# Patient Record
Sex: Male | Born: 1935 | Race: White | Hispanic: No | Marital: Married
Health system: Southern US, Community
[De-identification: ages and names within clinical notes are randomized; demographics above are authoritative.]

---

## 2004-08-08 ENCOUNTER — Ambulatory Visit: Payer: Self-pay | Admitting: Internal Medicine

## 2004-11-20 ENCOUNTER — Ambulatory Visit: Payer: Self-pay | Admitting: Internal Medicine

## 2005-10-10 ENCOUNTER — Other Ambulatory Visit: Payer: Self-pay

## 2005-10-10 ENCOUNTER — Emergency Department: Payer: Self-pay | Admitting: Emergency Medicine

## 2007-08-04 ENCOUNTER — Other Ambulatory Visit: Payer: Self-pay

## 2007-08-04 ENCOUNTER — Inpatient Hospital Stay: Payer: Self-pay | Admitting: Cardiology

## 2007-08-05 ENCOUNTER — Other Ambulatory Visit: Payer: Self-pay

## 2007-08-06 ENCOUNTER — Other Ambulatory Visit: Payer: Self-pay

## 2007-12-12 ENCOUNTER — Emergency Department: Payer: Self-pay | Admitting: Emergency Medicine

## 2007-12-12 ENCOUNTER — Other Ambulatory Visit: Payer: Self-pay

## 2008-02-06 ENCOUNTER — Ambulatory Visit: Payer: Self-pay | Admitting: Cardiology

## 2008-02-09 ENCOUNTER — Ambulatory Visit: Payer: Self-pay | Admitting: Cardiology

## 2008-02-13 ENCOUNTER — Inpatient Hospital Stay: Payer: Self-pay | Admitting: Cardiology

## 2008-02-22 ENCOUNTER — Emergency Department: Payer: Self-pay

## 2008-02-26 ENCOUNTER — Ambulatory Visit: Payer: Self-pay

## 2008-02-27 ENCOUNTER — Emergency Department: Payer: Self-pay | Admitting: Emergency Medicine

## 2008-02-29 ENCOUNTER — Inpatient Hospital Stay: Payer: Self-pay | Admitting: Internal Medicine

## 2008-03-05 ENCOUNTER — Encounter: Payer: Self-pay | Admitting: Internal Medicine

## 2008-03-10 ENCOUNTER — Ambulatory Visit: Payer: Self-pay | Admitting: Internal Medicine

## 2008-03-10 ENCOUNTER — Encounter: Payer: Self-pay | Admitting: Internal Medicine

## 2008-08-23 ENCOUNTER — Inpatient Hospital Stay: Payer: Self-pay | Admitting: Internal Medicine

## 2008-08-30 ENCOUNTER — Inpatient Hospital Stay: Payer: Self-pay | Admitting: Internal Medicine

## 2008-09-07 ENCOUNTER — Encounter: Payer: Self-pay | Admitting: Internal Medicine

## 2008-09-10 ENCOUNTER — Encounter: Payer: Self-pay | Admitting: Internal Medicine

## 2009-01-06 ENCOUNTER — Ambulatory Visit: Payer: Self-pay | Admitting: Internal Medicine

## 2009-02-02 ENCOUNTER — Inpatient Hospital Stay: Payer: Self-pay | Admitting: Vascular Surgery

## 2009-02-09 ENCOUNTER — Ambulatory Visit: Payer: Self-pay | Admitting: Podiatry

## 2009-02-11 ENCOUNTER — Ambulatory Visit: Payer: Self-pay | Admitting: Podiatry

## 2009-02-21 IMAGING — CR DG ABDOMEN 3V
1 series · 5 of 5 positions shown · non-contrast
Comparison: none

REASON FOR EXAM: CONSTIPATION
COMMENTS:

[Series 1: view not recorded · 0.17mm/px · 5 of 5 slices shown]
[im 1/5]
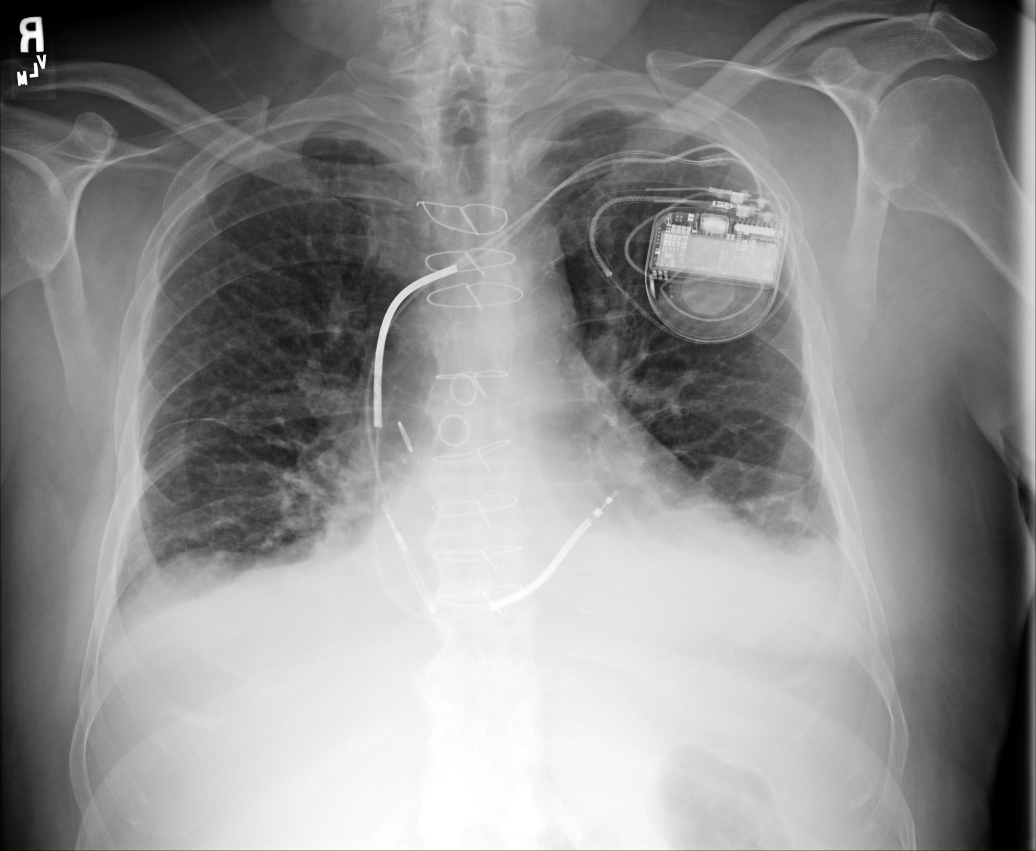
[im 2/5]
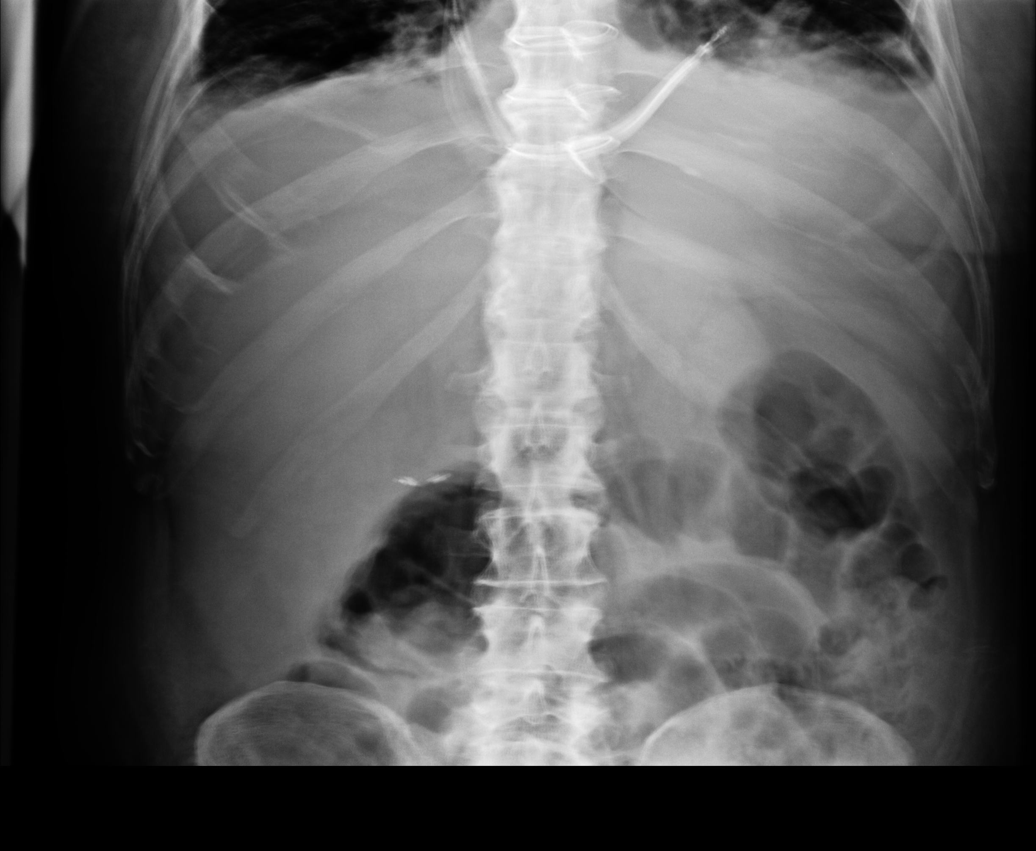
[im 3/5]
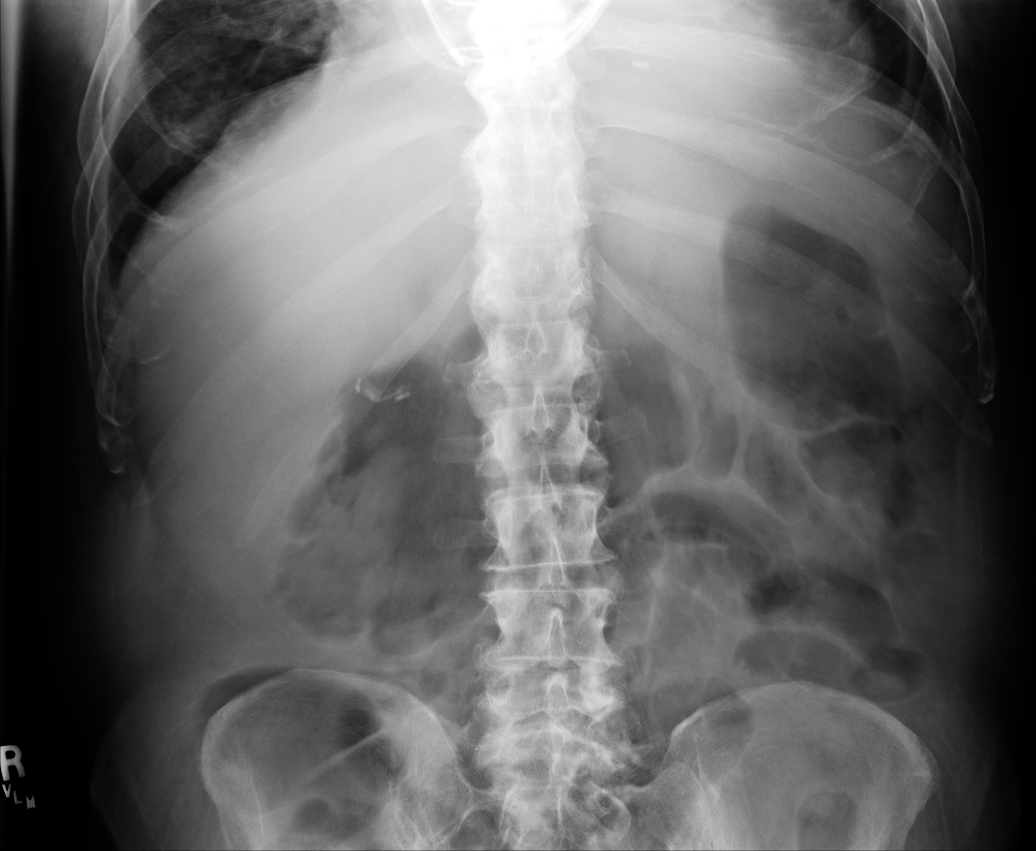
[im 4/5]
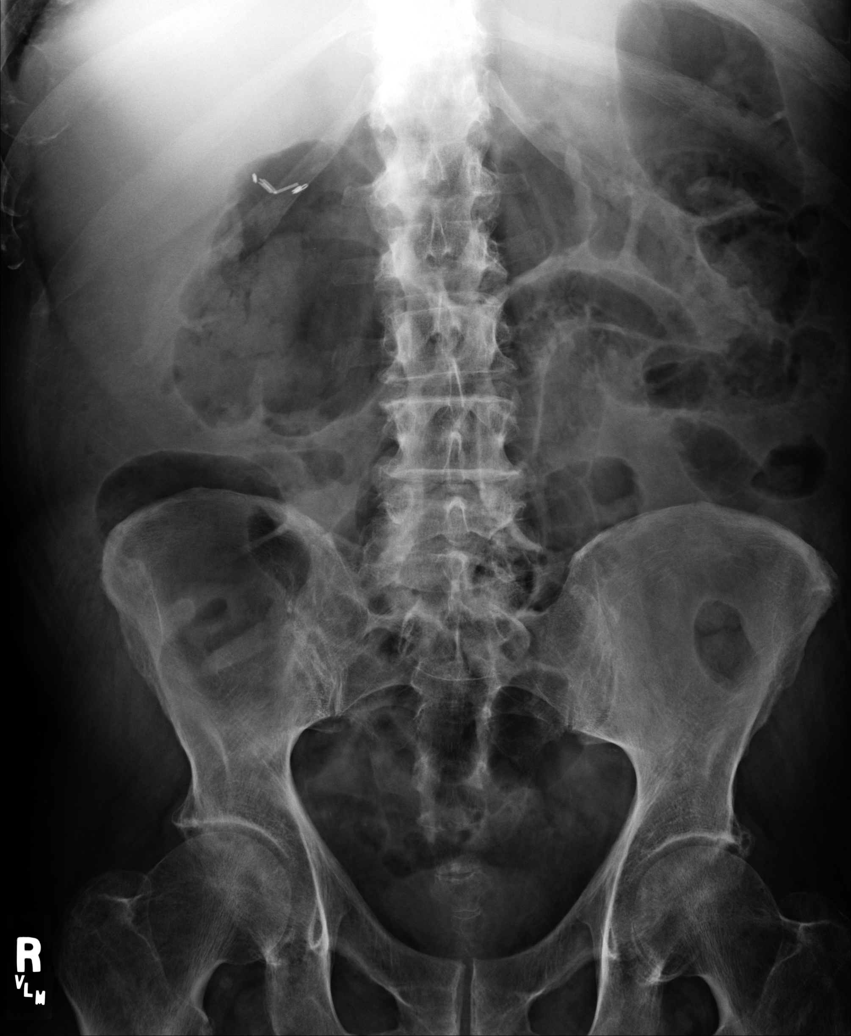
[im 5/5]
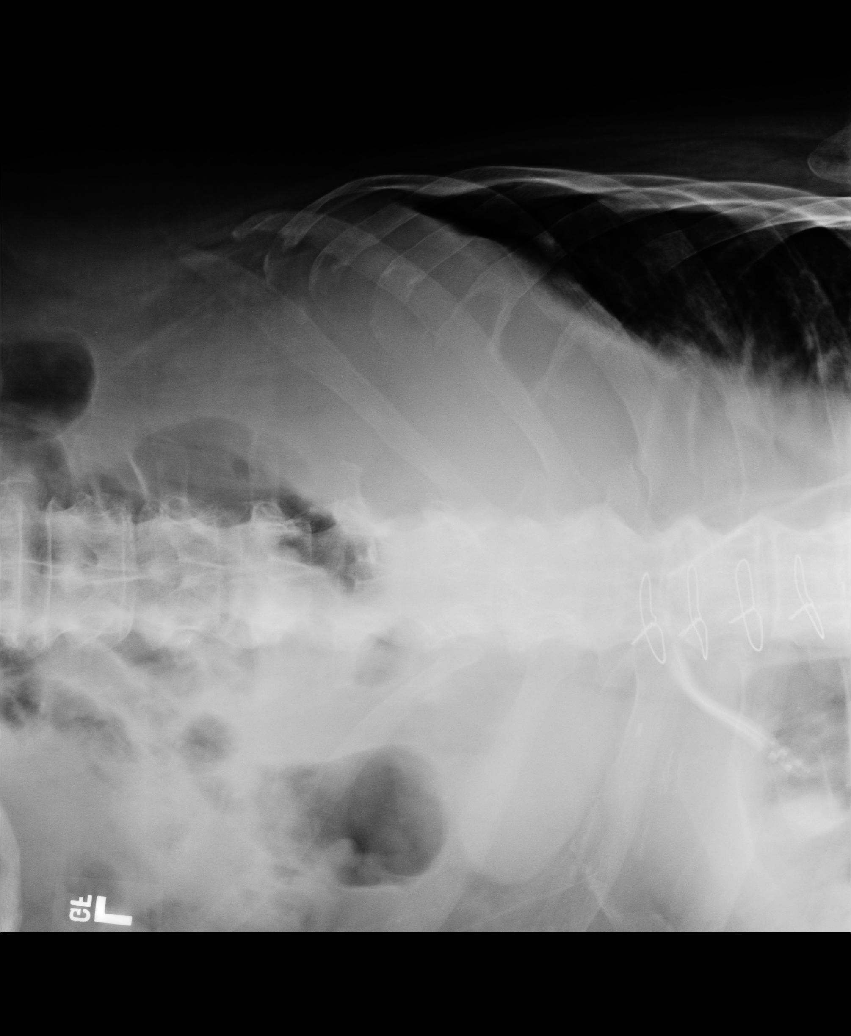

[5 of 5 positions shown; findings below may reference images not displayed]

PROCEDURE:     DXR - DXR ABDOMEN 3-WAY (INCL PA CXR)  - February 22, 2008  [DATE]

RESULT:     Comparison is made to a study 14 February, 2008.

The lungs are hypoinflated. The interstitial markings are more prominent
bilaterally. The LEFT hemidiaphragm is partially obscured. There is a
permanent pacemaker defibrillator in place. The patient has undergone prior
CABG. Within the abdomen,  there are loops of mildly distended gas-filled
small and large bowel present. The pattern is nonspecific, however, with no
finding to suggest obstruction. No free extraluminal gas is seen. There is
degenerative disc disease of the lumbar spine. There are surgical clips in
the gallbladder fossa.
IMPRESSION: 1.     The bowel gas pattern is nonspecific. No objective evidence of
obstruction or significant ileus is seen at this point. I cannot exclude
early ileus.
2.     There are findings which may reflect an element of cardiac
decompensation with pulmonary interstitial edema and small LEFT pleural
effusion. Continued followup films would be of value.

## 2009-09-27 ENCOUNTER — Inpatient Hospital Stay: Payer: Self-pay | Admitting: Internal Medicine

## 2010-07-21 ENCOUNTER — Ambulatory Visit: Payer: Self-pay | Admitting: Internal Medicine

## 2010-08-09 ENCOUNTER — Ambulatory Visit: Payer: Self-pay | Admitting: Vascular Surgery

## 2010-12-04 ENCOUNTER — Ambulatory Visit: Payer: Self-pay | Admitting: Internal Medicine

## 2011-08-09 IMAGING — XA IR VASCULAR PROCEDURE
11 series · 15 of 22 positions shown · IV contrast (IODINE)
Comparison: none

[Series 1: care aorta · 1 of 2 slices shown]
[im 1/2]
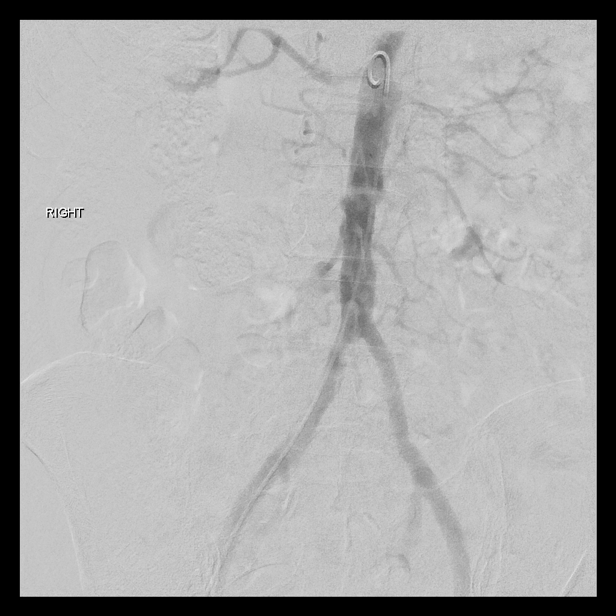

[Series 2: sfa · 2 of 2 slices shown (1 of 10)]
[im 1/2]
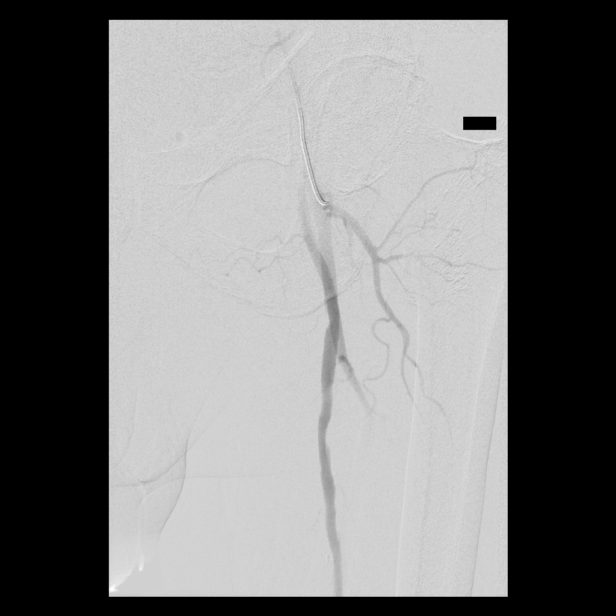
[im 2/2]
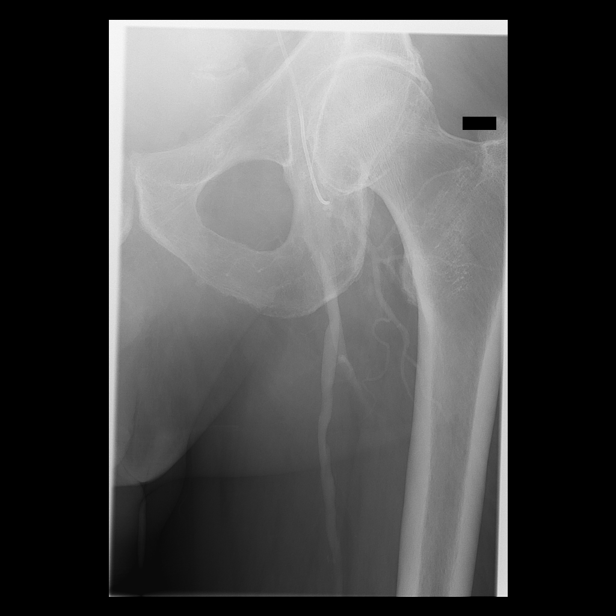

[Series 3: sfa · 1 of 2 slices shown (2 of 10)]
[im 2/2]
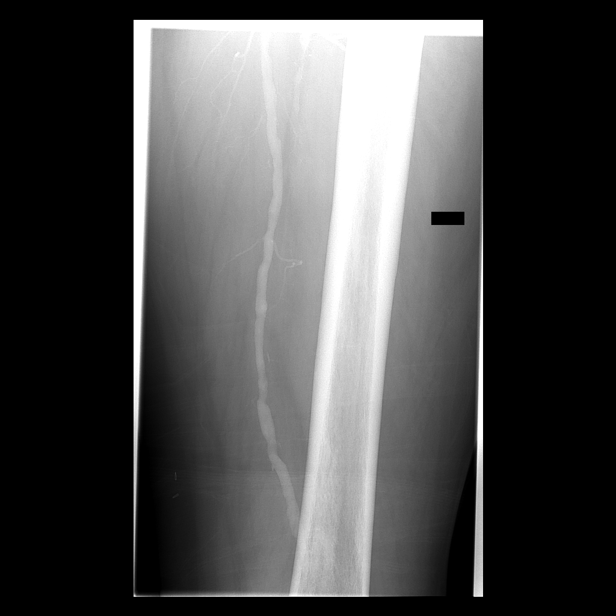

[Series 4: sfa · 1 of 2 slices shown (3 of 10)]
[im 1/2]
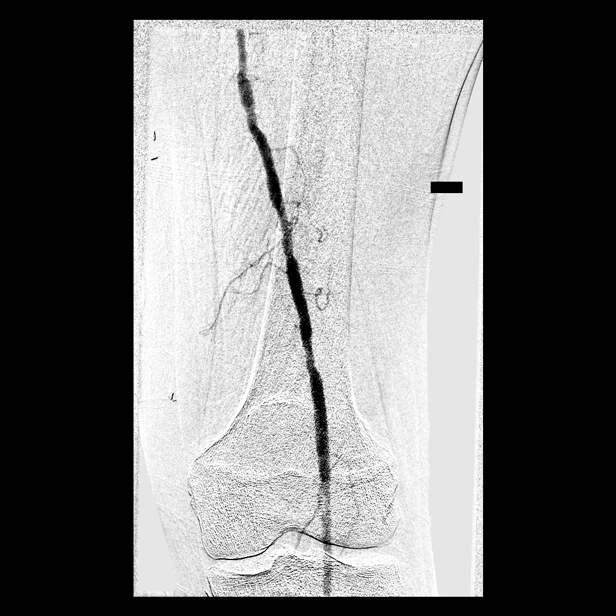

[Series 5: sfa · 2 of 2 slices shown (4 of 10)]
[im 1/2]
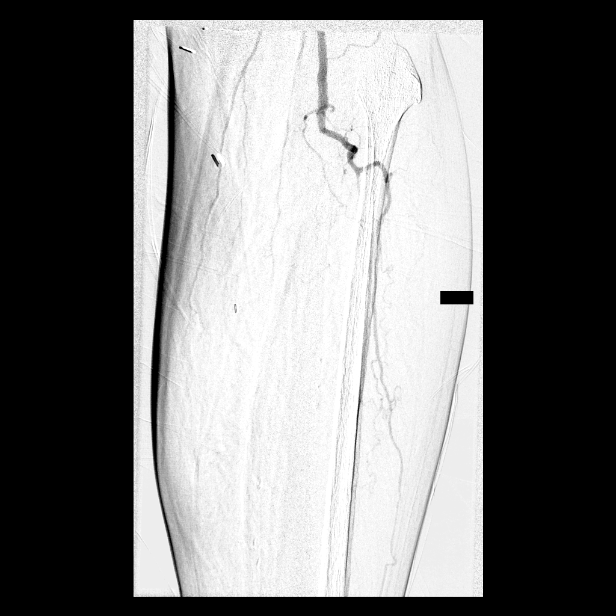
[im 2/2]
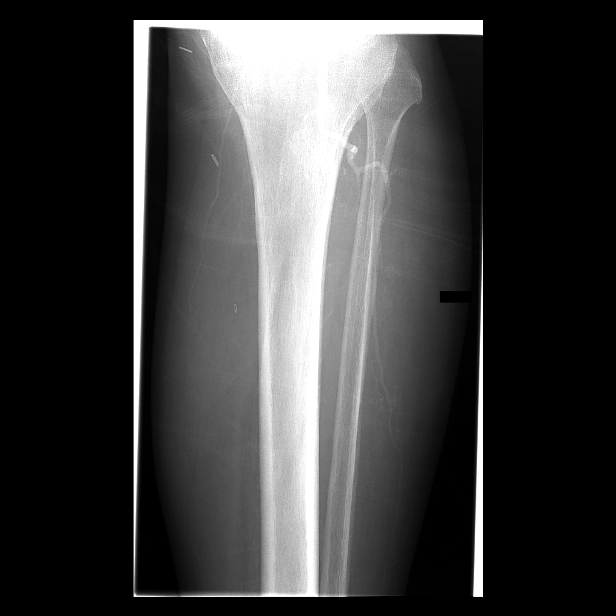

[Series 6: sfa · 1 of 2 slices shown (5 of 10)]
[im 2/2]
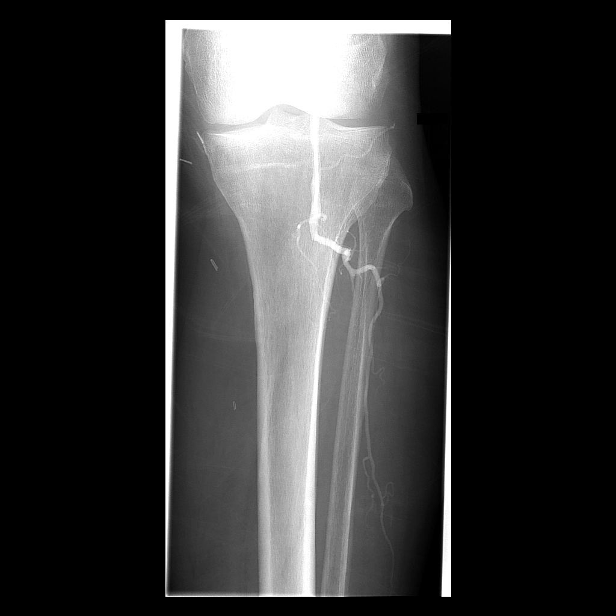

[Series 7: sfa · 2 of 2 slices shown (6 of 10)]
[im 1/2]
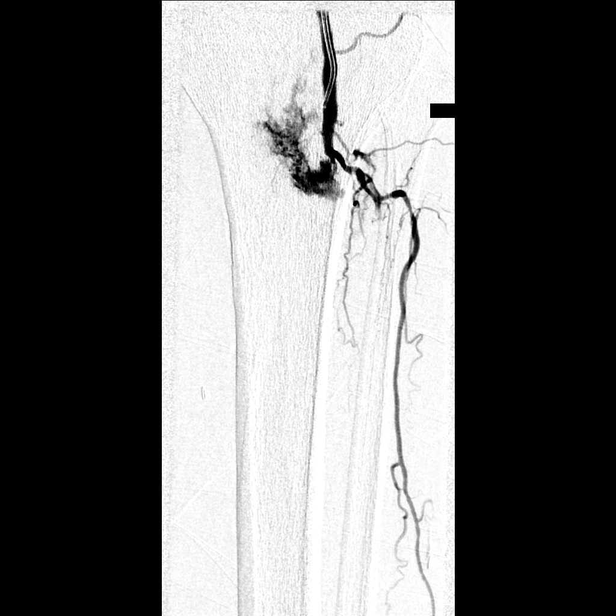
[im 2/2]
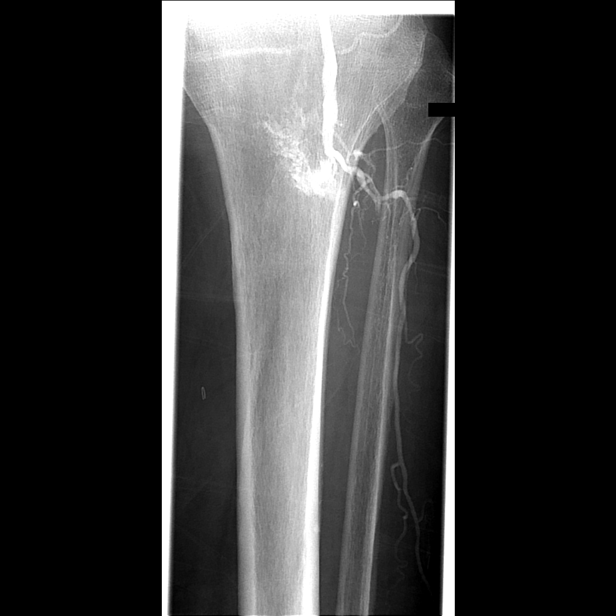

[Series 8: sfa · 1 of 2 slices shown (7 of 10)]
[im 2/2]
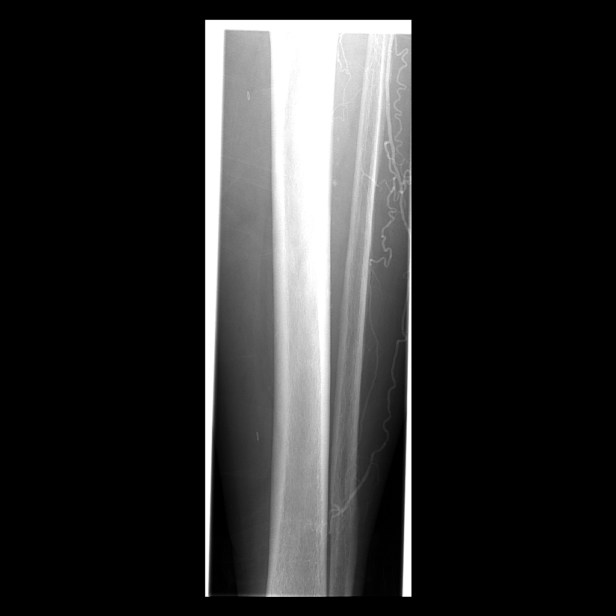

[Series 9: sfa · 1 of 2 slices shown (8 of 10)]
[im 1/2]
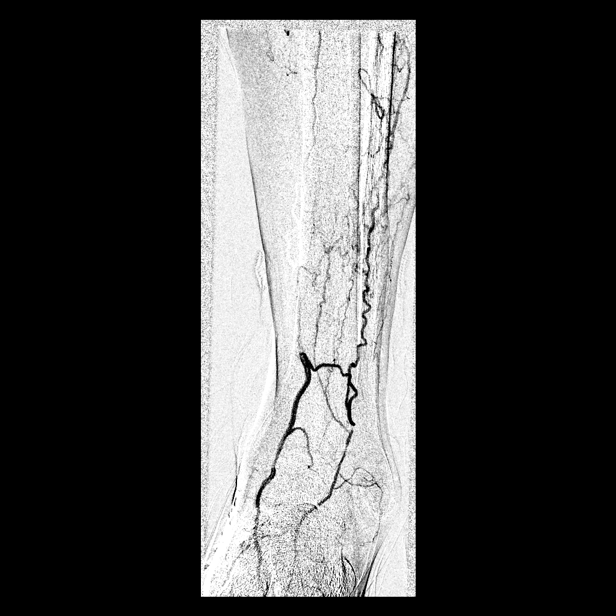

[Series 10: sfa · 2 of 2 slices shown (9 of 10)]
[im 1/2]
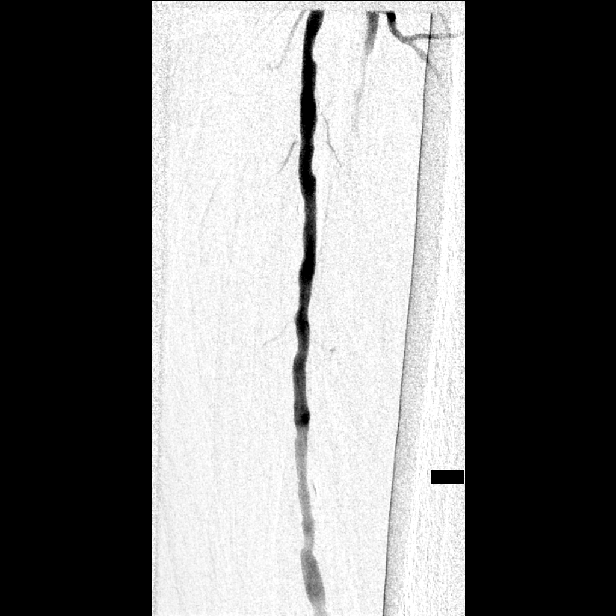
[im 2/2]
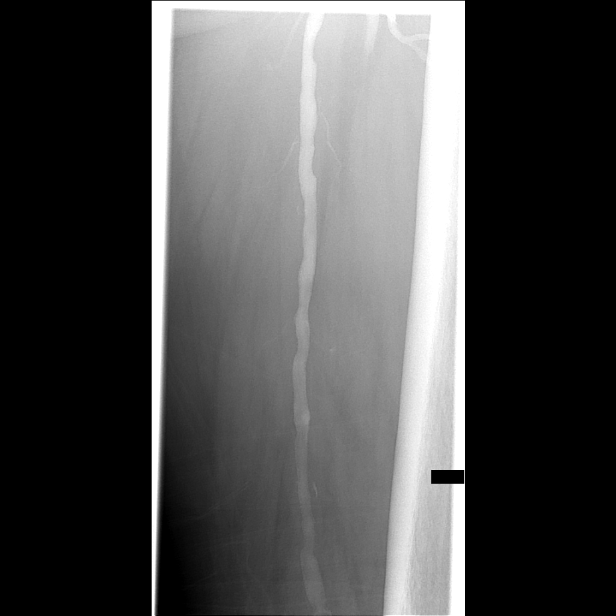

[Series 11: sfa · 1 of 2 slices shown (10 of 10)]
[im 2/2]
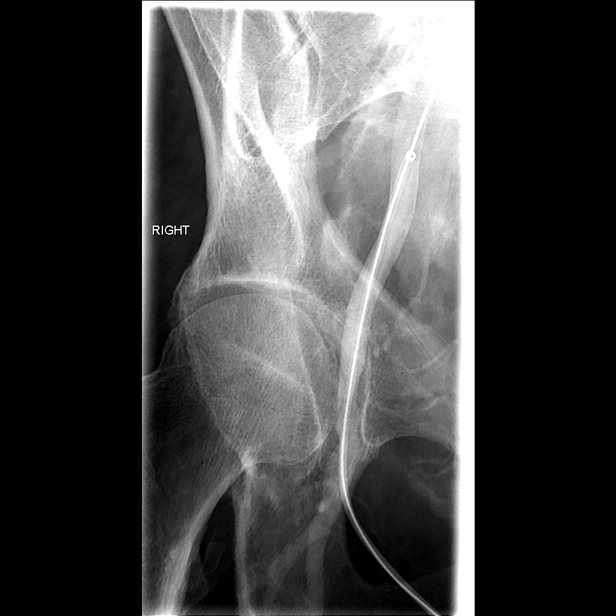

[15 of 22 positions shown; findings below may reference images not displayed]

IMAGES IMPORTED FROM THE [employer] WORKFLOW SYSTEM
NO DICTATION FOR STUDY

## 2011-12-16 ENCOUNTER — Inpatient Hospital Stay: Payer: Self-pay | Admitting: Internal Medicine

## 2011-12-16 LAB — COMPREHENSIVE METABOLIC PANEL
Albumin: 3.8 g/dL (ref 3.4–5.0)
Anion Gap: 9 (ref 7–16)
BUN: 20 mg/dL — ABNORMAL HIGH (ref 7–18)
Bilirubin,Total: 0.8 mg/dL (ref 0.2–1.0)
Calcium, Total: 8.4 mg/dL — ABNORMAL LOW (ref 8.5–10.1)
Co2: 28 mmol/L (ref 21–32)
Creatinine: 1.58 mg/dL — ABNORMAL HIGH (ref 0.60–1.30)
Glucose: 39 mg/dL — CL (ref 65–99)
Osmolality: 266 (ref 275–301)
Potassium: 3.4 mmol/L — ABNORMAL LOW (ref 3.5–5.1)
Sodium: 133 mmol/L — ABNORMAL LOW (ref 136–145)
Total Protein: 7.2 g/dL (ref 6.4–8.2)

## 2011-12-16 LAB — CBC
HCT: 37.5 % — ABNORMAL LOW (ref 40.0–52.0)
HGB: 12 g/dL — ABNORMAL LOW (ref 13.0–18.0)
Platelet: 194 10*3/uL (ref 150–440)

## 2011-12-16 LAB — CK TOTAL AND CKMB (NOT AT ARMC)
CK, Total: 73 U/L (ref 35–232)
CK-MB: 3 ng/mL (ref 0.5–3.6)

## 2011-12-16 LAB — TROPONIN I: Troponin-I: 0.05 ng/mL

## 2011-12-17 LAB — CK TOTAL AND CKMB (NOT AT ARMC)
CK, Total: 320 U/L — ABNORMAL HIGH (ref 35–232)
CK, Total: 504 U/L — ABNORMAL HIGH (ref 35–232)
CK-MB: 4.5 ng/mL — ABNORMAL HIGH (ref 0.5–3.6)
CK-MB: 5.3 ng/mL — ABNORMAL HIGH (ref 0.5–3.6)

## 2011-12-17 LAB — BASIC METABOLIC PANEL
BUN: 18 mg/dL (ref 7–18)
Calcium, Total: 8.3 mg/dL — ABNORMAL LOW (ref 8.5–10.1)
Co2: 29 mmol/L (ref 21–32)
Creatinine: 1.58 mg/dL — ABNORMAL HIGH (ref 0.60–1.30)
EGFR (African American): 54 — ABNORMAL LOW
EGFR (Non-African Amer.): 45 — ABNORMAL LOW
Glucose: 64 mg/dL — ABNORMAL LOW (ref 65–99)
Potassium: 3.7 mmol/L (ref 3.5–5.1)
Sodium: 136 mmol/L (ref 136–145)

## 2011-12-17 LAB — TROPONIN I: Troponin-I: 0.07 ng/mL — ABNORMAL HIGH

## 2011-12-17 LAB — CBC WITH DIFFERENTIAL/PLATELET
Basophil %: 0.3 %
HCT: 37.4 % — ABNORMAL LOW (ref 40.0–52.0)
HGB: 11.9 g/dL — ABNORMAL LOW (ref 13.0–18.0)
Lymphocyte #: 1 10*3/uL (ref 1.0–3.6)
Lymphocyte %: 8.1 %
MCH: 25.7 pg — ABNORMAL LOW (ref 26.0–34.0)
MCHC: 31.7 g/dL — ABNORMAL LOW (ref 32.0–36.0)
Monocyte #: 1.5 10*3/uL — ABNORMAL HIGH (ref 0.0–0.7)
Neutrophil #: 9.5 10*3/uL — ABNORMAL HIGH (ref 1.4–6.5)
Neutrophil %: 78.2 %
Platelet: 171 10*3/uL (ref 150–440)
RBC: 4.62 10*6/uL (ref 4.40–5.90)
RDW: 15.6 % — ABNORMAL HIGH (ref 11.5–14.5)

## 2011-12-18 LAB — BASIC METABOLIC PANEL
Anion Gap: 7 (ref 7–16)
BUN: 19 mg/dL — ABNORMAL HIGH (ref 7–18)
Calcium, Total: 8.3 mg/dL — ABNORMAL LOW (ref 8.5–10.1)
Chloride: 101 mmol/L (ref 98–107)
Co2: 28 mmol/L (ref 21–32)
Creatinine: 1.86 mg/dL — ABNORMAL HIGH (ref 0.60–1.30)
EGFR (African American): 45 — ABNORMAL LOW
EGFR (Non-African Amer.): 37 — ABNORMAL LOW
Glucose: 109 mg/dL — ABNORMAL HIGH (ref 65–99)
Osmolality: 275 (ref 275–301)
Potassium: 5.1 mmol/L (ref 3.5–5.1)
Sodium: 136 mmol/L (ref 136–145)

## 2011-12-18 LAB — CBC WITH DIFFERENTIAL/PLATELET
Basophil %: 1 %
Eosinophil %: 4.1 %
HCT: 36.5 % — ABNORMAL LOW (ref 40.0–52.0)
HGB: 11.5 g/dL — ABNORMAL LOW (ref 13.0–18.0)
Lymphocyte #: 1 10*3/uL (ref 1.0–3.6)
Lymphocyte %: 12.1 %
MCH: 25.5 pg — ABNORMAL LOW (ref 26.0–34.0)
MCV: 81 fL (ref 80–100)
Monocyte %: 15.5 %
Platelet: 182 10*3/uL (ref 150–440)
RDW: 16.4 % — ABNORMAL HIGH (ref 11.5–14.5)

## 2011-12-19 LAB — CBC WITH DIFFERENTIAL/PLATELET
Basophil #: 0 10*3/uL (ref 0.0–0.1)
Eosinophil %: 5.4 %
HGB: 11 g/dL — ABNORMAL LOW (ref 13.0–18.0)
Lymphocyte #: 0.9 10*3/uL — ABNORMAL LOW (ref 1.0–3.6)
MCH: 25.5 pg — ABNORMAL LOW (ref 26.0–34.0)
MCV: 82 fL (ref 80–100)
Monocyte #: 1 x10 3/mm (ref 0.2–1.0)
Monocyte %: 14.3 %
Neutrophil #: 4.5 10*3/uL (ref 1.4–6.5)
Neutrophil %: 66.1 %
Platelet: 164 10*3/uL (ref 150–440)
RDW: 16.4 % — ABNORMAL HIGH (ref 11.5–14.5)
WBC: 6.8 10*3/uL (ref 3.8–10.6)

## 2011-12-19 LAB — BASIC METABOLIC PANEL
Anion Gap: 11 (ref 7–16)
BUN: 26 mg/dL — ABNORMAL HIGH (ref 7–18)
Calcium, Total: 8.9 mg/dL (ref 8.5–10.1)
Chloride: 96 mmol/L — ABNORMAL LOW (ref 98–107)
Co2: 25 mmol/L (ref 21–32)
EGFR (African American): 41 — ABNORMAL LOW
EGFR (Non-African Amer.): 34 — ABNORMAL LOW
Glucose: 123 mg/dL — ABNORMAL HIGH (ref 65–99)
Osmolality: 271 (ref 275–301)
Potassium: 4.8 mmol/L (ref 3.5–5.1)
Sodium: 132 mmol/L — ABNORMAL LOW (ref 136–145)

## 2011-12-20 LAB — CBC WITH DIFFERENTIAL/PLATELET
Basophil %: 0.6 %
Eosinophil #: 0.4 10*3/uL (ref 0.0–0.7)
Eosinophil %: 4.8 %
HCT: 39 % — ABNORMAL LOW (ref 40.0–52.0)
HGB: 12.2 g/dL — ABNORMAL LOW (ref 13.0–18.0)
Lymphocyte %: 11.6 %
MCH: 25.7 pg — ABNORMAL LOW (ref 26.0–34.0)
MCHC: 31.3 g/dL — ABNORMAL LOW (ref 32.0–36.0)
MCV: 82 fL (ref 80–100)
Monocyte #: 1.2 x10 3/mm — ABNORMAL HIGH (ref 0.2–1.0)
Neutrophil %: 70.2 %
RBC: 4.76 10*6/uL (ref 4.40–5.90)
WBC: 9.1 10*3/uL (ref 3.8–10.6)

## 2011-12-20 LAB — RENAL FUNCTION PANEL
Albumin: 3.7 g/dL (ref 3.4–5.0)
Anion Gap: 15 (ref 7–16)
Calcium, Total: 9.5 mg/dL (ref 8.5–10.1)
Chloride: 96 mmol/L — ABNORMAL LOW (ref 98–107)
Creatinine: 1.98 mg/dL — ABNORMAL HIGH (ref 0.60–1.30)
EGFR (Non-African Amer.): 30 — ABNORMAL LOW
Glucose: 160 mg/dL — ABNORMAL HIGH (ref 65–99)
Osmolality: 272 (ref 275–301)
Phosphorus: 3.7 mg/dL (ref 2.5–4.9)
Potassium: 6.3 mmol/L — ABNORMAL HIGH (ref 3.5–5.1)
Sodium: 131 mmol/L — ABNORMAL LOW (ref 136–145)

## 2011-12-20 LAB — BASIC METABOLIC PANEL: Glucose: 162 mg/dL — ABNORMAL HIGH (ref 65–99)

## 2011-12-21 ENCOUNTER — Encounter: Payer: Self-pay | Admitting: Internal Medicine

## 2011-12-21 LAB — BASIC METABOLIC PANEL
BUN: 20 mg/dL — ABNORMAL HIGH (ref 7–18)
Calcium, Total: 9.4 mg/dL (ref 8.5–10.1)
Co2: 28 mmol/L (ref 21–32)
Creatinine: 2.02 mg/dL — ABNORMAL HIGH (ref 0.60–1.30)
EGFR (Non-African Amer.): 29 — ABNORMAL LOW
Glucose: 110 mg/dL — ABNORMAL HIGH (ref 65–99)
Sodium: 132 mmol/L — ABNORMAL LOW (ref 136–145)

## 2011-12-21 LAB — RENAL FUNCTION PANEL
Albumin: 3.6 g/dL (ref 3.4–5.0)
Phosphorus: 3.6 mg/dL (ref 2.5–4.9)

## 2012-01-18 ENCOUNTER — Ambulatory Visit: Payer: Self-pay | Admitting: Internal Medicine

## 2012-01-23 ENCOUNTER — Encounter: Payer: Self-pay | Admitting: Internal Medicine

## 2012-09-09 LAB — COMPREHENSIVE METABOLIC PANEL
Alkaline Phosphatase: 118 U/L (ref 50–136)
Bilirubin,Total: 0.2 mg/dL (ref 0.2–1.0)
Calcium, Total: 8.5 mg/dL (ref 8.5–10.1)
Chloride: 105 mmol/L (ref 98–107)
Co2: 21 mmol/L (ref 21–32)
Creatinine: 2.31 mg/dL — ABNORMAL HIGH (ref 0.60–1.30)
EGFR (African American): 29 — ABNORMAL LOW
EGFR (Non-African Amer.): 25 — ABNORMAL LOW
Glucose: 172 mg/dL — ABNORMAL HIGH (ref 65–99)
SGOT(AST): 26 U/L (ref 15–37)
SGPT (ALT): 25 U/L (ref 12–78)

## 2012-09-09 LAB — URINALYSIS, COMPLETE
Bacteria: NONE SEEN
Bilirubin,UR: NEGATIVE
Glucose,UR: 50 mg/dL (ref 0–75)
Ketone: NEGATIVE
Ph: 6 (ref 4.5–8.0)
Protein: NEGATIVE
RBC,UR: NONE SEEN /HPF (ref 0–5)
Specific Gravity: 1.012 (ref 1.003–1.030)
Squamous Epithelial: 1

## 2012-09-09 LAB — CBC
HCT: 39.6 % — ABNORMAL LOW (ref 40.0–52.0)
HGB: 13.4 g/dL (ref 13.0–18.0)
Platelet: 162 10*3/uL (ref 150–440)
RBC: 4.52 10*6/uL (ref 4.40–5.90)
WBC: 10 10*3/uL (ref 3.8–10.6)

## 2012-09-11 ENCOUNTER — Inpatient Hospital Stay: Payer: Self-pay | Admitting: Internal Medicine

## 2012-09-11 LAB — CBC WITH DIFFERENTIAL/PLATELET
Basophil #: 0.1 10*3/uL (ref 0.0–0.1)
HGB: 13.5 g/dL (ref 13.0–18.0)
Lymphocyte #: 1.2 10*3/uL (ref 1.0–3.6)
MCH: 30.1 pg (ref 26.0–34.0)
MCHC: 34.2 g/dL (ref 32.0–36.0)
MCV: 88 fL (ref 80–100)
Monocyte %: 12.5 %
Neutrophil #: 4.9 10*3/uL (ref 1.4–6.5)
Platelet: 157 10*3/uL (ref 150–440)
RBC: 4.49 10*6/uL (ref 4.40–5.90)
RDW: 14.3 % (ref 11.5–14.5)
WBC: 7.5 10*3/uL (ref 3.8–10.6)

## 2012-09-11 LAB — BASIC METABOLIC PANEL
Chloride: 106 mmol/L (ref 98–107)
Co2: 23 mmol/L (ref 21–32)
Creatinine: 2.22 mg/dL — ABNORMAL HIGH (ref 0.60–1.30)
EGFR (African American): 30 — ABNORMAL LOW
EGFR (Non-African Amer.): 26 — ABNORMAL LOW
Osmolality: 279 (ref 275–301)

## 2012-09-12 LAB — CBC WITH DIFFERENTIAL/PLATELET
Basophil #: 0.1 10*3/uL (ref 0.0–0.1)
Basophil %: 1.3 %
Eosinophil #: 0.3 10*3/uL (ref 0.0–0.7)
Eosinophil %: 4.7 %
HCT: 43 % (ref 40.0–52.0)
Lymphocyte #: 1.2 10*3/uL (ref 1.0–3.6)
Lymphocyte %: 16.8 %
MCHC: 34 g/dL (ref 32.0–36.0)
Monocyte #: 0.9 x10 3/mm (ref 0.2–1.0)
Monocyte %: 12.3 %
Neutrophil #: 4.6 10*3/uL (ref 1.4–6.5)
Neutrophil %: 64.9 %
Platelet: 147 10*3/uL — ABNORMAL LOW (ref 150–440)
RDW: 14.4 % (ref 11.5–14.5)
WBC: 7.1 10*3/uL (ref 3.8–10.6)

## 2012-09-12 LAB — BASIC METABOLIC PANEL
Chloride: 104 mmol/L (ref 98–107)
Creatinine: 2.19 mg/dL — ABNORMAL HIGH (ref 0.60–1.30)
EGFR (African American): 31 — ABNORMAL LOW
Glucose: 82 mg/dL (ref 65–99)
Potassium: 4.3 mmol/L (ref 3.5–5.1)
Sodium: 135 mmol/L — ABNORMAL LOW (ref 136–145)

## 2012-09-13 LAB — BASIC METABOLIC PANEL
Anion Gap: 11 (ref 7–16)
BUN: 35 mg/dL — ABNORMAL HIGH (ref 7–18)
Calcium, Total: 8.5 mg/dL (ref 8.5–10.1)
Chloride: 101 mmol/L (ref 98–107)
Co2: 22 mmol/L (ref 21–32)
Creatinine: 2.39 mg/dL — ABNORMAL HIGH (ref 0.60–1.30)
Osmolality: 277 (ref 275–301)
Potassium: 4.3 mmol/L (ref 3.5–5.1)

## 2012-09-15 LAB — BASIC METABOLIC PANEL
Anion Gap: 9 (ref 7–16)
BUN: 35 mg/dL — ABNORMAL HIGH (ref 7–18)
Chloride: 103 mmol/L (ref 98–107)
Co2: 22 mmol/L (ref 21–32)
EGFR (African American): 32 — ABNORMAL LOW
Glucose: 119 mg/dL — ABNORMAL HIGH (ref 65–99)
Potassium: 4.4 mmol/L (ref 3.5–5.1)
Sodium: 134 mmol/L — ABNORMAL LOW (ref 136–145)

## 2012-09-16 ENCOUNTER — Encounter: Payer: Self-pay | Admitting: Internal Medicine

## 2012-09-23 LAB — CBC WITH DIFFERENTIAL/PLATELET
Basophil #: 0.1 10*3/uL (ref 0.0–0.1)
Basophil %: 1.4 %
Eosinophil #: 0.4 10*3/uL (ref 0.0–0.7)
Eosinophil %: 5.7 %
HCT: 43.3 % (ref 40.0–52.0)
HGB: 14.4 g/dL (ref 13.0–18.0)
Lymphocyte #: 1 10*3/uL (ref 1.0–3.6)
Lymphocyte %: 15.2 %
MCH: 29.5 pg (ref 26.0–34.0)
MCHC: 33.4 g/dL (ref 32.0–36.0)
MCV: 88 fL (ref 80–100)
Monocyte #: 0.8 x10 3/mm (ref 0.2–1.0)
Monocyte %: 11.7 %
Neutrophil #: 4.5 10*3/uL (ref 1.4–6.5)
Neutrophil %: 66 %
Platelet: 153 10*3/uL (ref 150–440)
RBC: 4.9 10*6/uL (ref 4.40–5.90)
RDW: 14.5 % (ref 11.5–14.5)
WBC: 6.8 10*3/uL (ref 3.8–10.6)

## 2012-09-23 LAB — BASIC METABOLIC PANEL
Anion Gap: 6 — ABNORMAL LOW (ref 7–16)
BUN: 28 mg/dL — ABNORMAL HIGH (ref 7–18)
Calcium, Total: 8.6 mg/dL (ref 8.5–10.1)
Chloride: 99 mmol/L (ref 98–107)
Co2: 24 mmol/L (ref 21–32)
Creatinine: 1.89 mg/dL — ABNORMAL HIGH (ref 0.60–1.30)
EGFR (African American): 37 — ABNORMAL LOW
EGFR (Non-African Amer.): 32 — ABNORMAL LOW
Glucose: 129 mg/dL — ABNORMAL HIGH (ref 65–99)
Osmolality: 266 (ref 275–301)
Potassium: 4.3 mmol/L (ref 3.5–5.1)
Sodium: 129 mmol/L — ABNORMAL LOW (ref 136–145)

## 2012-09-23 LAB — URINALYSIS, COMPLETE
Blood: NEGATIVE
Ketone: NEGATIVE
Nitrite: NEGATIVE
Ph: 6 (ref 4.5–8.0)
RBC,UR: <1 /HPF (ref 0–5)
Specific Gravity: 1.011 (ref 1.003–1.030)
WBC UR: <1 /HPF (ref 0–5)

## 2012-09-25 ENCOUNTER — Emergency Department: Payer: Self-pay | Admitting: Emergency Medicine

## 2012-09-25 LAB — CBC
HCT: 44.1 % (ref 40.0–52.0)
HGB: 14.9 g/dL (ref 13.0–18.0)
MCV: 88 fL (ref 80–100)
Platelet: 184 10*3/uL (ref 150–440)
RBC: 5.04 10*6/uL (ref 4.40–5.90)

## 2012-09-25 LAB — URINALYSIS, COMPLETE
Blood: NEGATIVE
Glucose,UR: 50 mg/dL (ref 0–75)
Ketone: NEGATIVE
Nitrite: NEGATIVE
Ph: 6 (ref 4.5–8.0)
Protein: NEGATIVE
Squamous Epithelial: 1
WBC UR: 1 /HPF (ref 0–5)

## 2012-09-25 LAB — COMPREHENSIVE METABOLIC PANEL
Albumin: 4.2 g/dL (ref 3.4–5.0)
Alkaline Phosphatase: 131 U/L (ref 50–136)
Calcium, Total: 8.9 mg/dL (ref 8.5–10.1)
Creatinine: 1.8 mg/dL — ABNORMAL HIGH (ref 0.60–1.30)
EGFR (African American): 39 — ABNORMAL LOW
EGFR (Non-African Amer.): 34 — ABNORMAL LOW
Glucose: 128 mg/dL — ABNORMAL HIGH (ref 65–99)
Potassium: 4.7 mmol/L (ref 3.5–5.1)
SGPT (ALT): 31 U/L (ref 12–78)

## 2012-10-11 ENCOUNTER — Encounter: Payer: Self-pay | Admitting: Internal Medicine

## 2013-02-06 LAB — COMPREHENSIVE METABOLIC PANEL
Alkaline Phosphatase: 113 U/L (ref 50–136)
Bilirubin,Total: 0.5 mg/dL (ref 0.2–1.0)
Calcium, Total: 8.4 mg/dL — ABNORMAL LOW (ref 8.5–10.1)
Co2: 28 mmol/L (ref 21–32)
EGFR (Non-African Amer.): 39 — ABNORMAL LOW
Osmolality: 293 (ref 275–301)
Potassium: 3.7 mmol/L (ref 3.5–5.1)
Sodium: 141 mmol/L (ref 136–145)
Total Protein: 7.2 g/dL (ref 6.4–8.2)

## 2013-02-06 LAB — CBC
HCT: 37.9 % — ABNORMAL LOW (ref 40.0–52.0)
Platelet: 167 10*3/uL (ref 150–440)
RBC: 4.66 10*6/uL (ref 4.40–5.90)

## 2013-02-06 LAB — TROPONIN I: Troponin-I: 0.04 ng/mL

## 2013-02-06 LAB — CK TOTAL AND CKMB (NOT AT ARMC): CK-MB: 2.3 ng/mL (ref 0.5–3.6)

## 2013-02-07 ENCOUNTER — Inpatient Hospital Stay: Payer: Self-pay | Admitting: Internal Medicine

## 2013-02-07 LAB — CK TOTAL AND CKMB (NOT AT ARMC)
CK, Total: 87 U/L (ref 35–232)
CK-MB: 2.4 ng/mL (ref 0.5–3.6)
CK-MB: 2.7 ng/mL (ref 0.5–3.6)

## 2013-02-08 ENCOUNTER — Ambulatory Visit: Payer: Self-pay | Admitting: Nurse Practitioner

## 2013-02-08 LAB — BASIC METABOLIC PANEL
Anion Gap: 7 (ref 7–16)
BUN: 20 mg/dL — ABNORMAL HIGH (ref 7–18)
Calcium, Total: 8.4 mg/dL — ABNORMAL LOW (ref 8.5–10.1)
Chloride: 103 mmol/L (ref 98–107)
Creatinine: 1.75 mg/dL — ABNORMAL HIGH (ref 0.60–1.30)
EGFR (African American): 40 — ABNORMAL LOW
Potassium: 2.9 mmol/L — ABNORMAL LOW (ref 3.5–5.1)
Sodium: 141 mmol/L (ref 136–145)

## 2013-02-08 LAB — CBC WITH DIFFERENTIAL/PLATELET
Basophil %: 1.3 %
Eosinophil #: 0.5 10*3/uL (ref 0.0–0.7)
Eosinophil %: 5.6 %
Lymphocyte #: 1 10*3/uL (ref 1.0–3.6)
Lymphocyte %: 10.9 %
MCHC: 34.1 g/dL (ref 32.0–36.0)
MCV: 80 fL (ref 80–100)
Monocyte #: 1.1 x10 3/mm — ABNORMAL HIGH (ref 0.2–1.0)
Monocyte %: 12.2 %
Neutrophil #: 6.4 10*3/uL (ref 1.4–6.5)
Neutrophil %: 70 %
Platelet: 111 10*3/uL — ABNORMAL LOW (ref 150–440)
RBC: 4.73 10*6/uL (ref 4.40–5.90)
WBC: 9.2 10*3/uL (ref 3.8–10.6)

## 2013-02-08 LAB — TSH: Thyroid Stimulating Horm: 1.77 u[IU]/mL

## 2013-02-09 LAB — CBC WITH DIFFERENTIAL/PLATELET
Basophil #: 0.1 10*3/uL (ref 0.0–0.1)
Basophil %: 1.2 %
Eosinophil #: 0.4 10*3/uL (ref 0.0–0.7)
HCT: 34.2 % — ABNORMAL LOW (ref 40.0–52.0)
HGB: 11.5 g/dL — ABNORMAL LOW (ref 13.0–18.0)
Lymphocyte #: 0.9 10*3/uL — ABNORMAL LOW (ref 1.0–3.6)
MCH: 27 pg (ref 26.0–34.0)
MCV: 81 fL (ref 80–100)
Monocyte #: 0.9 x10 3/mm (ref 0.2–1.0)
Monocyte %: 14.7 %
Neutrophil #: 3.6 10*3/uL (ref 1.4–6.5)
Neutrophil %: 61.7 %
Platelet: 147 10*3/uL — ABNORMAL LOW (ref 150–440)
RBC: 4.25 10*6/uL — ABNORMAL LOW (ref 4.40–5.90)
WBC: 5.9 10*3/uL (ref 3.8–10.6)

## 2013-02-09 LAB — BASIC METABOLIC PANEL
Anion Gap: 7 (ref 7–16)
BUN: 25 mg/dL — ABNORMAL HIGH (ref 7–18)
Co2: 29 mmol/L (ref 21–32)
Creatinine: 1.95 mg/dL — ABNORMAL HIGH (ref 0.60–1.30)
EGFR (African American): 35 — ABNORMAL LOW
Potassium: 3 mmol/L — ABNORMAL LOW (ref 3.5–5.1)
Sodium: 139 mmol/L (ref 136–145)

## 2013-02-10 LAB — BASIC METABOLIC PANEL
Anion Gap: 5 — ABNORMAL LOW (ref 7–16)
BUN: 26 mg/dL — ABNORMAL HIGH (ref 7–18)
Chloride: 106 mmol/L (ref 98–107)
Co2: 30 mmol/L (ref 21–32)
Creatinine: 1.98 mg/dL — ABNORMAL HIGH (ref 0.60–1.30)
EGFR (Non-African Amer.): 30 — ABNORMAL LOW
Glucose: 120 mg/dL — ABNORMAL HIGH (ref 65–99)
Osmolality: 287 (ref 275–301)
Potassium: 3.9 mmol/L (ref 3.5–5.1)
Sodium: 141 mmol/L (ref 136–145)

## 2013-02-10 LAB — CBC WITH DIFFERENTIAL/PLATELET
Basophil #: 0.1 10*3/uL (ref 0.0–0.1)
Eosinophil #: 0.5 10*3/uL (ref 0.0–0.7)
Eosinophil %: 6.9 %
HCT: 34.3 % — ABNORMAL LOW (ref 40.0–52.0)
Lymphocyte #: 1 10*3/uL (ref 1.0–3.6)
MCH: 27.1 pg (ref 26.0–34.0)
Monocyte #: 0.9 x10 3/mm (ref 0.2–1.0)
Monocyte %: 13.4 %
Platelet: 118 10*3/uL — ABNORMAL LOW (ref 150–440)
RBC: 4.2 10*6/uL — ABNORMAL LOW (ref 4.40–5.90)
RDW: 15.7 % — ABNORMAL HIGH (ref 11.5–14.5)

## 2013-02-11 ENCOUNTER — Encounter: Payer: Self-pay | Admitting: Internal Medicine

## 2013-02-11 LAB — BASIC METABOLIC PANEL
Anion Gap: 6 — ABNORMAL LOW (ref 7–16)
Calcium, Total: 8.9 mg/dL (ref 8.5–10.1)
Chloride: 107 mmol/L (ref 98–107)
Co2: 27 mmol/L (ref 21–32)
EGFR (African American): 38 — ABNORMAL LOW
EGFR (Non-African Amer.): 33 — ABNORMAL LOW
Glucose: 61 mg/dL — ABNORMAL LOW (ref 65–99)
Potassium: 4 mmol/L (ref 3.5–5.1)
Sodium: 140 mmol/L (ref 136–145)

## 2013-02-11 LAB — CBC WITH DIFFERENTIAL/PLATELET
Basophil %: 1 %
Eosinophil #: 0.6 10*3/uL (ref 0.0–0.7)
HCT: 37.2 % — ABNORMAL LOW (ref 40.0–52.0)
Lymphocyte #: 0.9 10*3/uL — ABNORMAL LOW (ref 1.0–3.6)
Lymphocyte %: 11.1 %
MCHC: 33.5 g/dL (ref 32.0–36.0)
Monocyte #: 1 x10 3/mm (ref 0.2–1.0)
Monocyte %: 11.9 %
Neutrophil #: 5.6 10*3/uL (ref 1.4–6.5)
Neutrophil %: 68.6 %
RBC: 4.58 10*6/uL (ref 4.40–5.90)

## 2013-02-12 LAB — CULTURE, BLOOD (SINGLE)

## 2013-02-18 LAB — BASIC METABOLIC PANEL
BUN: 40 mg/dL — ABNORMAL HIGH (ref 7–18)
Calcium, Total: 9.1 mg/dL (ref 8.5–10.1)
Chloride: 103 mmol/L (ref 98–107)
EGFR (African American): 32 — ABNORMAL LOW
Glucose: 166 mg/dL — ABNORMAL HIGH (ref 65–99)
Osmolality: 278 (ref 275–301)
Potassium: 5.2 mmol/L — ABNORMAL HIGH (ref 3.5–5.1)
Sodium: 132 mmol/L — ABNORMAL LOW (ref 136–145)

## 2013-02-19 LAB — BASIC METABOLIC PANEL
Anion Gap: 5 — ABNORMAL LOW (ref 7–16)
BUN: 35 mg/dL — ABNORMAL HIGH (ref 7–18)
Calcium, Total: 9.2 mg/dL (ref 8.5–10.1)
Chloride: 102 mmol/L (ref 98–107)
Co2: 25 mmol/L (ref 21–32)
EGFR (African American): 35 — ABNORMAL LOW
Glucose: 121 mg/dL — ABNORMAL HIGH (ref 65–99)
Potassium: 4.6 mmol/L (ref 3.5–5.1)
Sodium: 132 mmol/L — ABNORMAL LOW (ref 136–145)

## 2013-03-10 ENCOUNTER — Ambulatory Visit: Payer: Self-pay | Admitting: Nurse Practitioner

## 2014-04-10 DEATH — deceased

## 2014-12-28 NOTE — H&P (Signed)
PATIENT NAME:  Bradley BornCLAPP, Bradley J MR#:  [id number] DATE OF BIRTH:  [birth year]  DATE OF ADMISSION:  09/09/2012  PRIMARY CARE PHYSICIAN:  Dr. Aram BeechamJeffrey Sparks.   CHIEF COMPLAINT:  Falls and difficulty walking.   HISTORY OF PRESENT ILLNESS:  This is an 81 year old male who presents from home due to recurrent falls and also difficulty ambulating. Most of the history is obtained from the daughter at bedside. As per the daughter, the patient does have some generalized weakness and does have falls every once in a while, but the fall that he had 2 days ago was a little bit different as he was shaking and then his legs gave way and he fell. His falls are usually mechanical, but this was a bit different. The patient was seen by [name] in his office about 3 days ago and noted to have a urinary tract infection and was put on Bactrim. The patient has only taken 3 days of Bactrim so far. He denies any prodromal symptoms like headache, dizziness, numbness or tingling, or any heaviness any of his extremities. He denies any other associated symptoms like chest pain, shortness of breath, nausea, vomiting, abdominal pain, fevers, chills, cough. Given the fact that he still seems pretty ataxic and has significant tremors, hospitalist service was contacted for further treatment and evaluation.   REVIEW OF SYSTEMS:  CONSTITUTIONAL: No documented fever. No weight gain or weight loss.  EYES: No blurry or double vision.  ENT: No tinnitus, no postnasal drip, no redness of the oropharynx.  RESPIRATORY: No cough, no wheeze, no hemoptysis.  CARDIOVASCULAR: No chest pain, no orthopnea, no palpitations, no syncope.  GASTROINTESTINAL: No nausea, no vomiting, no diarrhea, no abdominal pain, no melena or hematochezia.  GENITOURINARY: No dysuria, no hematuria.  ENDOCRINE: No polyuria or nocturia. No heat or cold intolerance.  HEMATOLOGIC: No anemia. No bruising. No bleeding.  INTEGUMENTARY: No rashes. No lesions.  MUSCULOSKELETAL:  No arthritis, no swelling, no gout.  NEUROLOGIC: No numbness or tingling. Positive ataxia, no seizure-type activity.  PSYCHIATRIC: No anxiety, no insomnia, no ADD.   PAST MEDICAL HISTORY:  Consistent with diabetes, hypertension, history of pacemaker/AICD placement, glaucoma, BPH, GERD.   ALLERGIES:  No known drug allergies.   SOCIAL HISTORY:  No smoking. No alcohol abuse. No illicit drug abuse. Lives at home with his wife.   FAMILY HISTORY:  Father died from a stroke. Mother died from cancer of unknown type.   CURRENT MEDICATIONS:  Alphagan 0.1% drops b.i.d., aspirin 81 mg daily, Coreg 3.125 mg b.i.d., Effexor 75 mg daily, Glucotrol 10 mg daily, Januvia 50 mg daily, Lantus 16 units daily, Lotemax 0.5% drops 1 drop to each affected eye daily, Nitro-Dur 0.3 mg topically daily, Proscar 5 mg daily and Protonix 40 mg daily.   PHYSICAL EXAMINATION: VITAL SIGNS: Temperature 97.6, pulse 82, respirations 18, blood pressure 144/97, sats 99% on room air.  GENERAL: He is a pleasant appearing male in no apparent distress.  HEENT: Atraumatic, normocephalic. Extraocular muscles are intact. Pupils equal, round and reactive to light. Sclerae anicteric. No conjunctival injection. No pharyngeal erythema.  NECK: Supple. There is no jugular venous distention, no bruits, no lymphadenopathy, no thyromegaly.  HEART: Regular rate and rhythm. No murmurs, rubs, or clicks.  LUNGS: Clear to auscultation bilaterally. No rales, no rhonchi, no wheezes.  ABDOMEN: Soft, flat, nontender, nondistended. Has good bowel sounds. No hepatosplenomegaly appreciated.  EXTREMITIES: No evidence of any cyanosis, clubbing, or peripheral edema. He has +2 pedal and radial pulses bilaterally.  NEUROLOGICAL: The patient is alert, awake, and oriented x 3. He has a positive finger-to-nose test. He has significant tremor bilaterally. Reflexes are +2. Strength is good bilaterally. No other sensory deficits appreciated.  SKIN: Moist and warm with  no rashes.  LYMPHATIC: There is no cervical or axillary lymphadenopathy.   DIAGNOSTIC DATA:  Serum glucose 172, BUN 29, creatinine 2.3, sodium 136, potassium 4.6, chloride 105, bicarbonate 21. Troponin is 0.03. LFTs are within normal limits. White cell count 10, hemoglobin 13.3, hematocrit 39.6, platelet count 162. Urinalysis is within normal limits.   The patient did have a CT of the head done without contrast which showed chronic changes, but no acute intracranial abnormality.   ASSESSMENT AND PLAN:  This is an 81 year old male with history of diabetes, hypertension, depression, glaucoma and benign prostatic hypertrophy who presents to the hospital with recurrent falls and also difficulty walking with an ataxic gait.  1.  Recurrent falls with ataxic gait. The exact etiology of this is unclear presently. Questionable if this is related to a stroke versus deconditioning versus possibly underlying Parkinson's disease. The patient's CT head is negative. I would like to get an MRI, but the patient has an automatic implantable cardiac defibrillator so therefore I cannot do that. For now, we will continue him on his aspirin. I will get a carotid duplex, also get a 2-dimensional echocardiogram and get a physical therapy consult. I will get a neurology consult to help assist with management and follow him clinically.  2.  Diabetes, no evidence of any hypoglycemia episodes. Continue Glucotrol, Januvia and Lantus. Follow blood sugars closely and place him on a carb-controlled diet.  3.  Hypertension. Continue Coreg.  4.  Gastroesophageal reflux disease. Continue Protonix.  5.  Glaucoma. Continue Alphagan eyedrops.  6.  Depression. Continue Effexor.  7.  Recent urinary tract infection secondary to Escherichia coli. Continue Bactrim. His urinalysis here looks fairly normal.   CODE STATUS:  The patient is a full code.   The patient will be transferred [name]' service.   TIME SPENT:  50 minutes.     ____________________________ [name]. [name], MD vjs:si D: 09/09/2012 16:23:00 ET T: 09/09/2012 17:22:08 ET JOB#: [id number]  cc: [name]. [name], MD, <Dictator> Houston Siren MD ELECTRONICALLY SIGNED 09/09/2012 21:00

## 2014-12-31 NOTE — Discharge Summary (Signed)
PATIENT NAME:  Bradley Payne, Bradley Payne MR#:  [id number] DATE OF BIRTH:  [birth year]  DATE OF ADMISSION:  09/11/2012 DATE OF DISCHARGE:  09/15/2012  TYPE OF DISCHARGE: The patient is transferred to a skilled nursing facility.   REASON FOR ADMISSION: Ataxia with dysarthria.   HISTORY OF PRESENT ILLNESS: The patient is an 81 year old male with a history of orthostasis, syncope, coronary artery disease, and stage 3 chronic kidney disease with diabetes who presented to the Emergency Room with recurrent falls and difficulty ambulating. In the Emergency Room, the patient was noted to be ataxic with dysarthria. Initial head CT was negative. He was admitted for further evaluation.   PAST MEDICAL HISTORY:  1. ASCVD. 2. Ischemic cardiomyopathy status post pacemaker/defibrillator placement.  3. Glaucoma.  4. BPH. 5. GE reflux disease.  6. Type 2 diabetes.  7. Benign hypertension.  8. Hyperlipidemia.   MEDICATIONS ON ADMISSION: Please see admission note.   ALLERGIES: No known drug allergies.   SOCIAL HISTORY: Negative for alcohol or tobacco abuse. He is married and lives with his wife.   FAMILY HISTORY: Positive for stroke and unknown cancer.   REVIEW OF SYSTEMS: As per HPI.  PHYSICAL EXAMINATION:  GENERAL: The patient was in no acute distress.  VITAL SIGNS: Stable and he was afebrile.  HEENT: Exam was unremarkable.  NECK: Supple without JVD.  LUNGS: Clear.  CARDIAC: Exam revealed a regular rate and rhythm with normal S1 and S2.  ABDOMEN: Soft and nontender.  EXTREMITIES: Without edema.  NEUROLOGIC: Exam revealed gross ataxia and dysmetria.   HOSPITAL COURSE: The patient was admitted with dysarthria and dysmetria with ataxia and frequent falls. Initial head CT was negative. The patient was unable to have an MRI of the brain because of his pacemaker. His exam on presentation was consistent with a cerebellar stroke. He was treated accordingly. Carotid Dopplers were unremarkable. Echocardiogram showed  an ejection fraction of approximately 25%. There was no evidence of heart failure during this hospitalization. He was maintained on Plavix with improvement of his symptoms. He was seen by Speech Therapy, Physical Therapy, and Occupational Therapy. His renal function and sugars remained stable in the hospital. Placement was recommended. During the hospitalization, the patient had a Foley catheter which was removed. He had persistent urinary retention and subsequently, the Foley was replaced. He is now transferred to a skilled nursing facility with a Foley catheter for further care and treatment.   DISCHARGE DIAGNOSES:  1. 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  2. Atherosclerotic cardiovascular disease.  3. Ischemic cardiomyopathy status post pacemaker and defibrillator.  4. Type 2 diabetes.  5. Stage 3 chronic kidney disease.  6. Benign prostatic hypertrophy.  7. Recent urinary tract infection.  8. Hypertension.  9. Gastroesophageal reflux disease.  10. Glaucoma.  11. Depression.   DISCHARGE MEDICATIONS:  1. Brimonidine 0.15% eye drops, one drop in each eye b.i.d.  2. Coreg 3.125 mg p.o. b.i.d.  3. Lovenox 30 mg subcu daily.  4. Proscar 5 mg p.o. daily.  5. Lantus 20 units subcu at bedtime.  6. [name]  low dose sliding scale insulin with Accu-Cheks q.a.c. and at bedtime.  7. Lotemax eye drops, one drop in each eye daily.  8. Protonix 40 mg p.o. daily.  9. Plavix 75 mg p.o. daily.  10. Effexor XR 75 mg p.o. daily.  11. Januvia 50 mg p.o. daily.  12. Cipro 250 mg p.o. b.i.d. x5 days.  13. Mevacor 20 mg p.o. q. p.m.  14. Lomotil 2.5 mg p.o.  q.6 hours p.r.n. diarrhea.   FOLLOW-UP PLANS AND APPOINTMENTS: The patient will be followed by myself at [facility]. He is on a 2-gram sodium, 1800-calorie ADA diet with chopped meats. He will be seen in consultation by Physical Therapy, Occupational Therapy, and Speech Therapy. Will obtain a CBC, MET-B, and a urinalysis in one  week. We will discontinue the Foley on January 8th and replace if necessary. He will follow up with me in the office in two weeks. Sooner if needed.     ____________________________ Bradley Douglas Doy Hutching, MD jds:es D: 09/15/2012 07:59:28 ET T: 09/15/2012 08:08:02 ET JOB#: [id number]  cc: Bradley Douglas. Doy Hutching, MD, <Dictator> Bradley Lennice Sites MD ELECTRONICALLY SIGNED 09/15/2012 9:25

## 2014-12-31 NOTE — Consult Note (Signed)
Brief Consult Note: Diagnosis: CHF, acute on chronic systolic, and acute diastolic.   Patient was seen by consultant.   Consult note dictated.   Comments: REC  Agree with current therapy, cont diuresis, uptitrate carvedilol.  Electronic Signatures: Marcina MillardParaschos, Bowdy Bair (MD)  (Signed 31-May-14 10:03)  Authored: Brief Consult Note   Last Updated: 31-May-14 10:03 by Marcina MillardParaschos, Flint Hakeem (MD)

## 2014-12-31 NOTE — Consult Note (Signed)
PATIENT NAME:  Bradley Payne, Bradley Payne MR#:  [id number] DATE OF BIRTH:  Jun 24, 1925  DATE OF CONSULTATION:  02/07/2013  CONSULTING PHYSICIAN:  Bradley Millard, MD  PRIMARY CARE PHYSICIAN: Duane Lope. Sparks, MD  CHIEF COMPLAINT: Weakness and shortness of breath.   HISTORY OF PRESENT ILLNESS: The patient is an 81 year old gentleman with known coronary artery disease, status post bypass graft surgery, ischemic cardiomyopathy, status post ICD. The patient has had prior history of transient ischemic attacks with aphasia. On the morning of admission, the patient presented to the Emergency Room with chief complaint of generalized weakness and slurred speech as well as shortness of breath. Upon presentation, blood pressure was elevated. The patient was experiencing cough with dyspnea and noted to have a recent 3 to 4 pound weight gain. Head CT was performed, which did not reveal any acute changes. Chest x-ray revealed pulmonary edema. The patient was treated with intravenous Lasix and empiric therapy with intravenous Rocephin for a possible pneumonia. Admission labs were notable for a troponin of 0.04.   PAST MEDICAL HISTORY: 1.  Status post coronary artery bypass graft surgery in 1996 at [facility].  2.  Status post ICD approximately 10 years ago for ischemic cardiomyopathy at [facility].  3.  Dilated cardiomyopathy with ejection fraction of 25% by echocardiogram in 09/2012. 4.  Hypertension.  5.  Prior transient ischemic attacks with slurred speech, aphasia.   MEDICATIONS: Aspirin 81 mg daily, clopidogrel 75 mg daily, lovastatin 20 mg daily, carvedilol 3.125 mg b.i.d., Proscar 5 mg daily, Protonix 40 mg daily, Lomotil 2.5/0.025 q.6 p.r.n., Lantus insulin 20 units subcutaneous daily, Januvia 50 mg daily, Effexor 1 capsule daily, Alphagan eye drops b.i.d.   FAMILY HISTORY: The patient is married and resides with his wife. Has a remote tobacco abuse history.   FAMILY  HISTORY: No immediate family history for myocardial infarction or coronary artery disease.   REVIEW OF SYSTEMS:    CONSTITUTIONAL: The patient does have generalized weakness.  EYES: The patient is legally blind.  EARS: No hearing loss.  RESPIRATORY: The patient does have shortness of breath.  CARDIOVASCULAR: No chest pain.  GASTROINTESTINAL: No nausea, vomiting, diarrhea or constipation.  GENITOURINARY: No dysuria or hematuria.  ENDOCRINE: No polyuria or polydipsia.  HEMATOLOGICAL: No easy bruising or bleeding.  INTEGUMENTARY: No rash.  MUSCULOSKELETAL: No arthralgias or myalgias.  NEUROLOGICAL: The patient has had multiple TIAs in the past.  PSYCHIATRIC: The patient denies depression or anxiety.   PHYSICAL EXAMINATION: VITAL SIGNS: Blood pressure 152/74, pulse 77, respirations 20, temperature 97.3, pulse oximetry 95%.  HEENT: Pupils equal and reactive to light and accommodation.  NECK: Supple without thyromegaly.  LUNGS: Clear.  HEART: Normal JVP. Diffuse PMI. Regular rate and rhythm. Normal S1, S2. No appreciable gallop, murmur or rub.  ABDOMEN: Soft and nontender.  EXTREMITIES: Pulses were intact bilaterally.  MUSCULOSKELETAL: Normal muscle tone.  NEUROLOGICAL: The patient is alert and oriented x 3. Motor and sensory both grossly intact.   IMPRESSION: This is an 81 year old gentleman with known coronary artery disease, ischemic cardiomyopathy, status post implantable cardiac defibrillator, who presents with chief complaint of generalized weakness, shortness of breath and fluid retention with elevated blood pressure, consistent with acute on chronic systolic congestive heart failure as well as acute diastolic congestive heart failure, which has initially responded to intravenous Lasix.   RECOMMENDATIONS: 1.  Agree with overall current therapy.  2.  Would defer full-dose anticoagulation.  3.  Would continue diuresis with intravenous furosemide. 4.  Up-titrate carvedilol to 6.25 mg  b.i.d.  5.  No further cardiac noninvasive or invasive cardiac diagnostics at this time.   ____________________________ Bradley MillardAlexander Estephani Popper, MD ap:jm D: 02/07/2013 10:01:58 ET T: 02/07/2013 15:38:05 ET JOB#: [id number]  cc: Bradley MillardAlexander Kelby Adell, MD, <Dictator> Bradley MillardALEXANDER Adolphe Fortunato MD ELECTRONICALLY SIGNED 02/16/2013 8:48

## 2014-12-31 NOTE — H&P (Signed)
PATIENT NAME:  Bradley Payne, [name] MR#:  [id number] DATE OF BIRTH:  [birth year]  DATE OF ADMISSION:  02/07/2013  PRIMARY CARE PHYSICIAN:  Dr. Aram BeechamJeffrey Sparks   REFERRING M.D.  Dr. Ladona Ridgelaylor   CHIEF COMPLAINT:  Generalized weakness, shortness of breath, weight gain, elevated blood pressure and cough.   HISTORY OF PRESENT ILLNESS:  He is an 81 year old pleasant Caucasian male with past medical history of systolic congestive heart failure, hypertension, diabetes mellitus, glaucoma, pacemaker and ACID. He is presenting to the ER with a chief complaint of generalized weakness with some amount of slurry speech, elevated blood pressure, shortness of breath associated with cough for 1 day. The patient was feeling weak and recently he had 2 to 3 episodes of transient ischemic attacks and this morning they noticed some speech difficulty. According to the wife at bedside, his speech is not up to his marker, but it is getting better now during my examination. Blood pressure is elevated and he was feeling short of breath, associated with cough. Wife is reporting that he has gained 3 pounds in the past 3 to 4 days. The patient has chronic vision problems from diabetes and macular degeneration. Denies any chest pain or abdominal pain. No nausea, vomiting. In the ER, a CT scan of the head was done which showed some chronic age-related changes. Blood pressure being elevated, he was given IV medication. Also, chest x-ray has revealed pulmonary edema and right-sided developing infiltrate, regarding which patient has received IV Lasix and IV Rocephin and hospitalist team is called to admit the patient.   PAST MEDICAL HISTORY:  Systolic congestive heart failure, on Lasix. Recent echocardiogram on January 2014 has revealed an ejection fraction of 25%. Diabetes mellitus, hypertension, glaucoma, benign prostatic hypertrophy, gastroesophageal reflux disease, history of pacemaker, automated implantable cardiac defibrillator placement.    PAST SURGICAL HISTORY:  Pacemaker, automated implantable cardiac defibrillator.   ALLERGIES:  NO KNOWN DRUG ALLERGIES.   HOME MEDICATIONS:   1.  Proscar 5 mg once daily.  2.  Plavix 75 mg once daily.  3.  Protonix 40 mg once daily.  4.  Lovastatin 20 mg once daily.  5.  Lomotil 2.5 mg/0.025 mg q.6h. as needed.  6.  Lantus 20 units subcutaneously once daily.  7.  Januvia 50 mg once daily.  8.  Effexor 1 capsule p.o. once daily.  9.  Coreg 3.125 mg p.o. b.i.d. 10.  Aspirin 81 mg once daily.  11.  Alphagan eyedrops 1 drop to each affected eye twice a day.   PSYCHOSOCIAL HISTORY:  Lives at home with wife. No history of smoking, alcohol or illicit drug usage.   FAMILY HISTORY:  Father died from stroke. Mother died from cancer of unknown type.    REVIEW OF SYSTEM: CONSTITUTIONAL:  Complaining of weakness and fatigue. Gained 3 to 4 pounds weight in the past 3 to 4 days. Denies any fever.  EYES:  The patient is legally blind from diabetes and macular degeneration, as well as glaucoma. Denies any eye pain or redness.  ENT:  Denies any tinnitus, epistaxis, discharge or difficulty in swallowing.  RESPIRATORY:  This patient complaining of cough and shortness of breath for 1 day. Denies any COPD, hemoptysis or painful respirations.  CARDIOVASCULAR:  No chest pain, palpitation, syncope.  GASTROINTESTINAL:  No nausea, vomiting, diarrhea, GERD.  GENITOURINARY:  Denies any dysuria, hematuria or hernia problems.  ENDOCRINE:  Denies any polyuria or nocturia. Has insulin-dependent diabetes mellitus. Denies any thyroid problems.  HEMATOLOGIC AND LYMPHATIC:  No anemia, easy bruising.  INTEGUMENTARY:  No acne, rash lesions.  MUSCULOSKELETAL:  No joint pain in the neck, back, shoulder. Denies any gout.  NEUROLOGICAL:  Has multiple transient ischemic attacks in the past. Denies any vertigo, ataxia, dementia.  PSYCHIATRIC:  Denies any ADD, OCD, bipolar disorder.   PHYSICAL EXAMINATION: VITAL SIGNS:   Temperature 98.3, pulse 94, respiration 22, blood pressure is 188/94, pulse oximetry 93%.  GENERAL APPEARANCE:  Not in acute distress. Moderately built and moderately nourished.  HEENT:  Normocephalic, atraumatic. Pupils are equally reacting to light and accommodation. The patient is legally blind. No scleral icterus. No conjunctival injection. No sinus tenderness. No postnasal drip. No pharyngeal exudate.  NECK:  Supple. No JVD, no thyromegaly and no lymphadenopathy.  LUNGS:  Positive crackles and rales. No wheezing. No accessory muscle used and no anterior chest wall tenderness on palpation.  CARDIAC:  S1, S2 normal. Regular rate and rhythm. Positive murmurs and no gallops.   GASTROINTESTINAL:  Soft. Bowel sounds are positive in all 4 quadrants. Nontender, nondistended. No masses felt. No hepatosplenomegaly.  NEUROLOGIC:  Awake, alert, oriented x 3. Motor and sensory are grossly intact. Cranial nerves II through XII are intact. Reflexes are 2+. No cerebellar signs.   EXTREMITIES:  Edema 1 to 2+ is present. No cyanosis, no clubbing.  MUSCULOSKELETAL:  No joint effusion, tenderness, erythema.  SKIN:  Warm to touch. Normal turgor. No rashes. No lesions.  PSYCHIATRIC:  Normal mood and affect.   LABORATORIES AND IMAGING STUDIES:  CT scan of the head has revealed no change compared to 09/25/2012. Diffuse cerebral atrophy with hypoattenuation in the periventricular white matter, compatible with small vessel ischemia. No hematemesis. No acute abnormalities. A 12-lead EKG has revealed sinus tachycardia at 108 beats per minute, normal PR and QRS interval, nonspecific ST-T wave changes. Chest x-ray has revealed right-sided infiltrate, pulmonary edema, intact pacer/defibrillator. BNP 9915, glucose 247, BUN 22, creatinine 1.59. Sodium 141, potassium 3.7, chloride 104, CO2 28. Anion gap is 9. GFR is 39. Calcium 8.4. Serum osmolality 293. CK total 82. CK-MB 2.3, Troponin 0.04. WBC 8.1, hemoglobin 12.2, hematocrit  37.9, platelets 167, RDW 16.2. LFTs are within normal range.   ASSESSMENT AND PLAN:  An 81 year old Caucasian male brought into the ER with chief complaint of generalized weakness, elevated blood pressure, shortness of breath with cough and 3 to 4 pounds weight gain in the past 3 to 4 days.   ASSESSMENT AND PLAN:    1.  Pneumonia, probably community acquired. Will put him on IV Rocephin and Zithromax.  2. Acute exacerbation of congestive heart failure, systolic in nature as the patient's recent echocardiogram in January 2014 has revealed an ejection fraction of 25%. We will provide him with Lasix IV, aspirin, beta blocker and statin. We will consider ACE inhibitor if renal function is stable.  3.  Hypertensive urgency. We will resume home medications and up-titrate medications on an as needed basis. The patient is on Lasix intravenous and Lopressor intravenous will be provided on an as needed basis.  4.  Legal blindness from diabetic retinopathy and macular degeneration.  5.  Cardiology consult is placed with Dr. Darrold Junker.  6.  Possible transient ischemic attack, as patient is having weakness and slurry speech, but which is clearing up and close to baseline according to the patient's wife. Will continue close monitoring and get neuro checks. If the symptoms are not resolving, will consider getting MRA of the brain, carotid Dopplers and 2-D echocardiogram.   The patient  will be transferred to [name] from [facility] in a.m. The patient is a DNR. We will provide him gastrointestinal and deep vein thrombosis prophylaxis. The diagnosis and plan of care was discussed in detail with the patient and his wife at bedside. We will consider providing him a private room as  recommended by the family.   Total time spent on admission is 50  minutes.   ____________________________ Ramonita Lab, MD ag:nts D: 02/07/2013 03:10:34 ET T: 02/07/2013 07:53:41 ET JOB#: [id number]  cc: Ramonita Lab, MD,  <Dictator> Duane Lope. Judithann Sheen, MD  Ramonita Lab MD ELECTRONICALLY SIGNED 02/14/2013 23:00

## 2014-12-31 NOTE — Discharge Summary (Signed)
PATIENT NAME:  Bradley Payne, Serge J MR#:  [id number] DATE OF BIRTH:  1925-02-23  DISCHARGE/TRANSFER SUMMARY  DATE OF ADMISSION:  02/07/2013 DATE OF DISCHARGE:  02/11/2013  TYPE OF DISCHARGE: The patient is transferred to a skilled nursing facility.   REASON FOR ADMISSION: Acute-on chronic systolic congestive heart failure.   HISTORY OF PRESENT ILLNESS: The patient is an 81 year old male with a significant history of ASCVD with ischemic cardiomyopathy, status post pacemaker and defibrillator placement. Has chronic systolic congestive heart failure. Presented to the Emergency Room with shortness of breath where he was found to be in worsening heart failure, with hypoxia. He was subsequently admitted for further evaluation.   PAST MEDICAL HISTORY: 1.  Chronic systolic congestive heart failure.  2.  Ischemic cardiomyopathy.  3.  Status post pacer and defibrillator placement.  4.  Type 2 diabetes.  5.  Glaucoma.  6.  Benign hypertension.  7.  Orthostatic hypotension.  8.  Benign prostatic hypertrophy.  9.  Gastroesophageal reflux disease.  10.  Atherosclerotic coronary vascular disease.   MEDICATIONS ON ADMISSION: Please see admission note.   ALLERGIES: No known drug allergies.   SOCIAL HISTORY: The patient is married. No history of alcohol or tobacco abuse.   FAMILY HISTORY: Positive for stroke, and cancer of unknown type.   REVIEW OF SYSTEMS: As per admission note.   PHYSICAL EXAM: The patient was in no acute distress.  VITAL SIGNS: Vital signs are stable and he was afebrile.  HEENT EXAM: Was unremarkable.  NECK: Was supple, without JVD.  LUNGS: Revealed rales bilaterally. No wheezes or rhonchi.  CARDIAC: Regular rate and rhythm. Normal S1, S2. No significant rubs or gallops. Murmur was present.  ABDOMEN: Soft, nontender, with normoactive bowel sounds. No organomegaly or masses were appreciated. No hernias or bruits were noted.  EXTREMITIES: Revealed 2+ edema bilaterally. No cyanosis  or clubbing.  NEUROLOGICAL: Exam was grossly nonfocal.  PSYCHIATRIC: Exam revealed a patient who was alert and oriented to person, place, and time. He was cooperative and used good judgment.   HOSPITAL COURSE: The patient was admitted with acute-on-chronic congestive heart failure which was systolic in nature. His ejection fraction done less than six months ago was 25% by echo. He was initially felt to have pneumonia. Followup films revealed no evidence of pneumonia. He was placed on IV Lasix with beta blocker therapy. He was seen by cardiology, who agreed with medical management. The patient diuresed well and was quickly weaned off oxygen. He was switched to oral diuretics and continued to do well. He was seen in consultation by physical therapy. He is extremely weak and having difficulty with ambulation. Placement was recommended. The family agreed. He is now transferred to the skilled nursing facility for further care and treatment.   DISCHARGE DIAGNOSES: 1.  Acute-on-chronic systolic congestive heart failure.  2.  Ischemic cardiomyopathy, status post defibrillator and pacemaker implant.  3.  Atherosclerotic coronary vascular disease.  4.  Type 2 diabetes mellitus. 5.  Glaucoma.  6.  Chronic renal insufficiency.  7.  Benign hypertension.  8.  Benign prostatic hypertrophy.  9.  Chronic ataxia.  10.  Orthostatic hypotension.  11.  Gastroesophageal reflux disease.     DISCHARGE MEDICATIONS: 1.  Aspirin 325 mg p.o. daily.  2.  Plavix 75 mg p.o. daily.  3.  Lantus 20 units subcutaneous at bedtime Harpers low dose sliding-scale insulin, with Accu-Cheks before meals and at bedtime.  5.  Brimonidine eyedrops, as directed.  6.  Coreg 6.25 mg p.o.  b.i.d.  7.  Proscar 5 mg p.o. daily.  8.  Zestril 5 mg p.o. daily.  9.  Mevacor 20 mg p.o. in the evening.   10.  Nitrostat 0.4 mg sublingually p.r.n., chest pain.  11.  Protonix 40 mg p.o. daily.  12.  Effexor-XR 75 mg p.o. daily.  13.  Lasix 40  mg p.o. daily.  14.  Mag-Ox 400 mg p.o. daily.  15.  K-Dur 20 mEq p.o. b.i.d.  16.  Flomax 0.4 mg p.o. daily.  17.  Colace 100 mg p.o. b.i.d.   FOLLOWUP PLANS AND APPOINTMENTS: The patient will be followed by myself at [facility].   He is on a 2 gram sodium, 1800 calorie ADA diet.   He will be seen in consultation by physical therapy for ambulation.   He is a NO CODE BLUE/DO NOT RESUSCITATE.   He will follow up with me at my office at the [facility] next week; sooner if needed.     ____________________________ Duane Lope Judithann Sheen, MD jds:dm D: 02/11/2013 07:49:55 ET T: 02/11/2013 08:13:32 ET JOB#: [id number]  cc: Duane Lope. Judithann Sheen, MD, <Dictator> Maybel Dambrosio Rodena Medin MD ELECTRONICALLY SIGNED 02/11/2013 13:12

## 2015-01-02 NOTE — Discharge Summary (Signed)
PATIENT NAME:  Bradley Payne, Bradley Payne MR#:  [id number] DATE OF BIRTH:  Jan 11, 1925  DATE OF ADMISSION:  12/16/2011 DATE OF DISCHARGE:  12/20/2011  TYPE OF DISCHARGE: The patient is transferred to a skill nursing facility.   REASON FOR ADMISSION: Acute on chronic systolic congestive heart failure associated  with hypoglycemia.   HISTORY OF PRESENT ILLNESS: The patient is an 81 year old male with a history of coronary artery disease status post CABG with an ischemic cardiomyopathy with an ejection fraction of approximately 25% who presented to the emergency room with worsening shortness of breath and weakness. He had had low blood sugars at home. In the emergency room, the patient was noted to be tachypneic. BNP was markedly elevated and chest x-ray suggested pulmonary edema with bilateral pleural effusions. His glucose was 39. He was subsequently admitted for further evaluation.   PAST MEDICAL HISTORY:  1. Atherosclerotic cardiovascular disease status post coronary artery bypass graft.  2. Ischemic cardiomyopathy.  3. Chronic systolic congestive heart failure.  4. History of orthostasis with syncope.  5. Chronic diarrhea.  6. Type 2 diabetes.  7. Chronic hypokalemia.  8. Diabetic neuropathy.  9. Benign essential tremor.  10. Benign prostatic hypertrophy. 11. Peripheral vascular disease with left carotid bruit.  12. Status post cholecystectomy.  13. Status post appendectomy.  14. Legal blindness with previous cataract surgery.   MEDICATIONS ON ADMISSION: Please see admission note.   ALLERGIES: No known drug allergies.   SOCIAL HISTORY: Negative for alcohol or tobacco abuse.   FAMILY HISTORY: Positive for hypertension and stroke.   REVIEW OF SYSTEMS: As per history of present illness.   PHYSICAL EXAMINATION:   GENERAL: The patient is in no acute distress.   VITALS: Vital signs are currently stable and he is afebrile.   HEENT: Exam is unremarkable.   NECK: Supple without jugular venous  distention. No adenopathy or thyromegaly is noted.   LUNGS: Basilar crackles without wheezes or dullness.   CARDIAC: Regular rate and rhythm with normal S1 and S2. There is a 2/6 systolic murmur noted. No rubs or gallops are present.   ABDOMEN: Soft and nontender with normoactive bowel sounds. No organomegaly or masses were appreciated. No hernias or bruits were noted.   EXTREMITIES: Trace edema. Pulses were 2+ bilaterally.   NEUROLOGIC: Exam was grossly nonfocal.   HOSPITAL COURSE: The patient was admitted with acute on chronic congestive heart failure with hypoglycemia. His diabetic medicines were held and he was started on D5 normal saline. His sugars were followed with Accu-Cheks before meals and at bedtime and sliding scale insulin was used as needed. His sugars normalized. He was restarted back on Januvia. He was maintained on an 1800 calorie ADA diet. Diabetic teaching did take place. For his congestive heart failure, he was started on IV Lasix with good diuresis. He was seen in consultation by cardiology who agreed with medical management. Subsequent chest x-rays revealed improvement of his pulmonary edema and bilateral pleural effusions. He was weaned off oxygen to room air. He was subsequently switched to oral diuretics and continued to do well. The patient did remain weak in the hospital. Ambulation was difficult. Transfers were difficult. Physical therapy was consulted. Short-term rehab was recommended. He is now transferred to the skilled nursing facility for further care and treatment.   DISCHARGE DIAGNOSES:  1. Acute on chronic systolic congestive heart failure.  2. Ischemic cardiomyopathy.  3. Coronary disease status post coronary artery bypass graft.  4. Hypoglycemia.  5. Type 2 diabetes.  6.  Chronic renal insufficiency.  7. Anemia of chronic disease.  8. Hypokalemia.  9. Hyponatremia.   DISCHARGE MEDICATIONS:  1. Januvia 100 mg p.o. daily.  2. Lasix 40 mg p.o. daily.   3. Protonix 40 mg p.o. twice a day. 4. Effexor-XR 75 mg p.o. daily.  5. Seroquel 25 mg p.o. at bedtime.  6. K-Dur 20 milliequivalents p.o. daily.  7. Nitro-Dur 0.3 mg topically daily.  8. Ativan 1 mg p.o. every four hours p.r.n. anxiety.  9. [name] low dose sliding scale insulin with Accu-Cheks before meals and at bedtime.  10. Avodart 0.5 mg p.o. daily. 11. Coreg 3.125 mg p.o. twice a day. 12. Aspirin 81 mg p.o. daily.  13. DuoNeb SVN every six hours while awake.  14. Ceftin 250 mg p.o. twice a day x10 days.  15. Bactroban cream to left arm twice a day.  FOLLOW-UP PLANS AND APPOINTMENTS: The patient will be followed by myself at [facility]. He is on an 1800 calorie ADA 2 gram sodium diet. He will be seen in       consultation by physical therapy. We will obtain a CBC and a MET-B in one week. He will follow up with me in the office in 1 to 2 weeks, sooner if needed.  ____________________________ Leonie Douglas Doy Hutching, MD jds:slb D: 12/20/2011 07:31:22 ET T: 12/20/2011 07:47:01 ET JOB#: [id number]  cc: Leonie Douglas. Doy Hutching, MD, <Dictator> Rylah Fukuda Lennice Sites MD ELECTRONICALLY SIGNED 12/20/2011 16:55

## 2015-01-02 NOTE — H&P (Signed)
PATIENT NAME:  Bradley Payne, Bradley Payne MR#:  [id number] DATE OF BIRTH:  10/31/1924  DATE OF ADMISSION:  12/16/2011  REFERRING PHYSICIAN: Dr. Buford DresserFinnell  FAMILY PHYSICIAN: [name]   REASON FOR ADMISSION: Acute on chronic systolic congestive heart failure associated with hypoglycemia.   HISTORY OF PRESENT ILLNESS: The patient is an 81 year old male with a significant history of coronary artery disease status post CABG with ischemic cardiomyopathy with an ejection fraction of approximately 25%. He has been followed by Dr. Darrold JunkerParaschos as an outpatient. He presents to the Emergency Room with worsening shortness of breath and weakness. He also has had some low blood sugars at home. In the Emergency Room, the patient was noted to be tachypneic. BNP was markedly elevated. Chest x-ray suggested pulmonary edema with bilateral pleural effusions. Also had a glucose of 39. Denies chest pain. He is now admitted for further evaluation.   PAST MEDICAL HISTORY:  1. Atherosclerotic cardiovascular disease status post coronary artery bypass graft.  2. Ischemic cardiomyopathy.  3. Chronic systolic congestive heart failure.  4. History of orthostasis with syncope.  5. Chronic diarrhea.  6. Type 2 diabetes mellitus.  7. Chronic hypokalemia.  8. Diabetic neuropathy.  9. Benign essential tremor.  10. Benign prostatic hypertrophy.  11. Peripheral vascular disease with left carotid bruit.  12. Previous cataract surgery.  13. Status post cholecystectomy.  14. Status post appendectomy.   MEDICATIONS:  1. Alphagan eyedrops as directed.  2. Azopt eyedrops as directed.  3. Aspirin 81 mg p.o. daily.  4. Avodart 0.5 mg p.o. daily.  5. Protonix 40 mg p.o. daily.  6. Ambien 5 mg p.o. at bedtime p.r.n. sleep.  7. Lantus 16 units subcutaneous at bedtime.  8. Effexor-XR 75 mg p.o. daily. 9. Seroquel 25 mg p.o. at bedtime.  10. Lasix 20 mg p.o. daily.  11. Combivent 3 puffs q.i.d. p.r.n. shortness of breath.   ALLERGIES: No  known drug allergies.   SOCIAL HISTORY: The patient is married. No history of alcohol or tobacco abuse.   FAMILY HISTORY: Positive for hypertension and stroke.   REVIEW OF SYSTEMS: CONSTITUTIONAL: No fever or change in weight. EYES: No blurred or double vision. ENT: No tinnitus or hearing loss. No nasal discharge or bleeding. No difficulty swallowing. RESPIRATORY: Some cough but no wheezing. Denies hemoptysis. No painful respiration. CARDIOVASCULAR: No chest pain, although he does have some orthopnea. No palpitations or syncope. GI: No nausea, vomiting, or diarrhea. No abdominal pain. No change in bowel habits. GU: No dysuria or hematuria. No incontinence. ENDOCRINE: No polyuria or polydipsia. No heat or cold intolerance. HEMATOLOGIC: The patient denies easy bruising or bleeding. Has chronic anemia. LYMPHATIC: Has swollen glands. MUSCULOSKELETAL: The patient denies pain in his neck, back, knees, or hips. He is having some shoulder pain. No gout. NEUROLOGIC: No numbness or migraines. No previous stroke or seizures. Does have generalized weakness. PSYCHIATRIC: The patient denies anxiety or depression, although he does admit to insomnia.   PHYSICAL EXAMINATION:  GENERAL: The patient is chronically ill-appearing, in no acute distress.   VITAL SIGNS: Vital signs are currently remarkable for a blood pressure of 138/66 with a heart rate of 103 and a respiratory rate of 27. He is afebrile.   HEENT: Normocephalic, atraumatic. Pupils equally round and reactive to light and accommodation. Extraocular movements are intact. Sclerae are anicteric. Conjunctivae are clear.  Oropharynx is clear.  NECK: Supple with a left carotid bruit noted. There was minimal JVD noted.   LUNGS: Basilar rales with scattered rhonchi. No  dullness.   CARDIAC: Regular rate and rhythm with normal S1 and S2. There is a 2/6 systolic murmur noted. No rubs or gallops present.   ABDOMEN: Soft, nontender, with normoactive bowel sounds. No  organomegaly or masses were appreciated. No hernias or bruits were noted.   EXTREMITIES: Trace edema. Pulses were 2+ bilaterally.   SKIN: Warm and dry without rash or lesions.   NEUROLOGIC: Cranial nerves II through XII grossly intact. Deep tendon reflexes were symmetric. Motor and sensory exam is nonfocal.   PSYCH: Exam revealed a patient who is alert and oriented to person, place, and time. He was cooperative and used good judgment.   LABORATORY, DIAGNOSTIC, AND RADIOLOGICAL DATA: ABG revealed a pO2 of 67 on room air with a saturation of 94%. BNP was 21,049. Troponin was 0.05. Glucose was 39 with a BUN of 20, and creatinine 1.58 with a sodium of 133 and a potassium of 3.4. GFR was 45. White count 11.2 with a hemoglobin of 12. EKG revealed sinus tachycardia with no acute ischemic changes. Chest x-ray revealed bilateral pleural effusions.   ASSESSMENT:  1. Acute on chronic systolic congestive heart failure.  2. Ischemic cardiomyopathy.  3. Coronary artery disease status post coronary artery bypass graft.  4. Hypoglycemia.  5. Type 2 diabetes.  6. Chronic renal insufficiency.  7. Anemia of chronic disease.  8. Hypokalemia.  9. Hyponatremia.   PLAN: The patient will be admitted to telemetry as a FULL CODE with IV Lasix for diuresis. We will supplement potassium. We will begin empiric nitrates and beta blocker therapy.  We will follow serial cardiac enzymes and obtain an echocardiogram. We will consult Dr. Darrold Junker, his cardiologist, to follow up on his heart disease and congestive heart failure. We will follow up a chest x-ray in the morning. We will supplement oxygen at this time and wean as tolerated. We will hold his Lantus at this time. We will begin D5 normal saline and follow his sugars with Accu-Cheks before meals and at bedtime.  Continue other outpatient medications at this time. We will begin empiric IV antibiotics with DuoNeb SVNs for bronchodilatation. Further treatment and  evaluation will depend upon the patient's progress.   TOTAL TIME SPENT ON ADMISSION: 50 minutes.     ____________________________ Duane Lope Judithann Sheen, MD jds:bjt D: 12/16/2011 19:11:27 ET T: 12/17/2011 08:05:42 ET JOB#: [id number]  cc: Duane Lope. Judithann Sheen, MD, <Dictator> Duane Lope. Judithann Sheen, MD Alianny Toelle Rodena Medin MD ELECTRONICALLY SIGNED 12/17/2011 10:29

## 2015-01-02 NOTE — Consult Note (Signed)
General Aspect the patient is an 81 year old male with history of a cardiomyopathy who was admitted after noting progressive weakness and fatigue with increased peripheral edema and hypoglycemia. He presented to the emergency room her he was noted to have a serum glucose in the 30s. He was also noted to have volume overload on chest x-ray as well as possible airspace disease. He was admitted and placed on diuretics as well as empiric antibiotics. His serum glucose continues to be relatively low. He complained of weakness and fatigue as well as dizziness on his feet. He states he has had relative anorexia recently but is continued on his current anti-hyperglycemics regimen. He does not do daily weights but does state to have a scale at home. Patient is legally blind but his wife can help with weight measurements.the patient is status post coronary artery bypass grafting and has an AICD defibrillator in place.   Physical Exam:   GEN obese, disheveled    HEENT hearing intact to voice    NECK supple    RESP no use of accessory muscles  crackles    CARD Regular rate and rhythm  Murmur    Murmur Systolic    Systolic Murmur axilla    ABD denies tenderness  normal BS  no Abdominal Bruits  no Adominal Mass    LYMPH negative neck    EXTR negative cyanosis/clubbing, positive edema    SKIN abrasion on his left forearm    NEURO cranial nerves intact, motor/sensory function intact    PSYCH A+O to time, place, person   Review of Systems:   Subjective/Chief Complaint shortness of breath, weakness, fatigue, anorexia    General: Fatigue  Weakness    Skin: several ecchymotic areas one in particular his left forearm    ENT: No Complaints    Eyes: legally blind    Neck: No Complaints    Respiratory: Frequent cough  Short of breath  Wheezing    Cardiovascular: Chest pain or discomfort  Edema    Gastrointestinal: No Complaints    Genitourinary: No Complaints    Vascular: No Complaints     Musculoskeletal: No Complaints    Neurologic: No Complaints    Hematologic: No Complaints    Endocrine: No Complaints    Psychiatric: No Complaints    Review of Systems: All other systems were reviewed and found to be negative    Medications/Allergies Reviewed Medications/Allergies reviewed     Gastric Reflux:    Myocardial Infarct:    Arrythmias:    Ulcer fourth toe right foot:    Renal Insufficiency:    Anemia:    Hypertension:    eye operations, legally blind:    prostate problems:    cholecystectomy:    quad bipass surgery:    diabetes:    Hx CHF:    Angioplasty right leg: May 2010   defib/pacemaker:   Home Medications: Medication Instructions Status  Effexor XR capsule, extended release 75 mg 1 cap(s) orally once a day  Active  Protonix 40 mg oral enteric coated tablet 1   once a day AM. TAKE AM OF SURGERY Active  glipiZIDE 10 mg oral tablet   2 times a day  Active  Aspir 81 oral enteric coated tablet   once a day . HOLD UNTIL AFTER SURGERY Active  timolol ophthalmic solution 0.5% 1 gtt in each eye once a day  Active  Alphagan P 0.15% ophthalmic solution 1 gtt drops to each eye 2 times a day  Active  Januvia 50 mg oral tablet 1 tab(s) orally once a day Active  Florinef Acetate 0.1 milligram(s) orally once a day Active  BuSpar 15 milligram(s) orally once a day Active  aurobindo 5 milligram(s)  once a day Active  questiapine fumarate 50   once (at bedtime) Active  Lantus 16 unit(s) subcutaneous once a day Active  fiber capsules   2 times a day Active   EKG:   Interpretation sinus tachycardia with nonspecific ST-T wave changes    No Known Allergies:     Impression 81 year old male with history of coronary artery disease with ischemic cardiomyopathy, history of an AICD placement secondary to his cardiomyopathy who was admitted with progressive shortness of breath, weakness and fatigue. Patient also has diabetes and was noted to be  significantly hypoglycemic several days prior to admission. He developed gradual weakness and fatigue. He does not do daily weights at home and therefore is unaware of whether or not he has gained any weight but has noted significant peripheral edema recently. He attempts to eat a low sodium diet. He denies any AICD firings. He denies syncope. He did have chest pain which was worse with deep cough. He is improved with empiric antibiotics as well as diuresis. Chest x-ray revealed small bilateral pleural effusions. He had no significant pericardial effusion on echocardiogram. His left ventricular function is significantly reduced.    Plan 1. Continue with careful diuresis 2. Increase ambulation 3. Close followup of his serum glucose and consider alteration of his anti-hyperglycemic agents 4. low sodium diet and daily weights 5. Further recommendations pending course   Electronic Signatures: Dalia HeadingFath, Kenitra Leventhal A (MD)  (Signed 08-Apr-13 21:01)  Authored: General Aspect/Present Illness, History and Physical Exam, Review of System, Past Medical History, Home Medications, EKG , Allergies, Impression/Plan   Last Updated: 08-Apr-13 21:01 by Dalia HeadingFath, Husam Hohn A (MD)
